# Patient Record
Sex: Female | Born: 1990 | Race: White | Hispanic: No | Smoking: Current every day smoker
Health system: Southern US, Community
[De-identification: ages and names within clinical notes are randomized; demographics above are authoritative.]

## PROBLEM LIST (undated history)

## (undated) DIAGNOSIS — E282 Polycystic ovarian syndrome: Secondary | ICD-10-CM

---
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
## 2008-01-05 ENCOUNTER — Emergency Department: Payer: Self-pay | Admitting: Emergency Medicine

## 2009-10-16 ENCOUNTER — Emergency Department: Payer: Self-pay | Admitting: Emergency Medicine

## 2011-04-16 ENCOUNTER — Emergency Department: Payer: Self-pay | Admitting: Emergency Medicine

## 2011-12-18 ENCOUNTER — Emergency Department: Payer: Self-pay | Admitting: Unknown Physician Specialty

## 2012-07-16 ENCOUNTER — Emergency Department: Payer: Self-pay | Admitting: Emergency Medicine

## 2012-07-16 LAB — URINALYSIS, COMPLETE
Bilirubin,UR: NEGATIVE
Blood: NEGATIVE
Nitrite: NEGATIVE
Ph: 6 (ref 4.5–8.0)
Specific Gravity: 1.026 (ref 1.003–1.030)
WBC UR: 20 /HPF (ref 0–5)

## 2012-07-16 LAB — HCG, QUANTITATIVE, PREGNANCY: Beta Hcg, Quant.: 125610 m[IU]/mL — ABNORMAL HIGH

## 2012-07-16 LAB — BASIC METABOLIC PANEL
Anion Gap: 7 (ref 7–16)
BUN: 6 mg/dL — ABNORMAL LOW (ref 7–18)
Calcium, Total: 8.8 mg/dL (ref 8.5–10.1)
Chloride: 107 mmol/L (ref 98–107)
Co2: 24 mmol/L (ref 21–32)
EGFR (African American): >60
EGFR (Non-African Amer.): >60
Glucose: 91 mg/dL (ref 65–99)
Osmolality: 273 (ref 275–301)
Sodium: 138 mmol/L (ref 136–145)

## 2012-07-16 LAB — CBC
HGB: 13.5 g/dL (ref 12.0–16.0)
MCH: 28.9 pg (ref 26.0–34.0)
MCV: 85 fL (ref 80–100)
RBC: 4.67 10*6/uL (ref 3.80–5.20)

## 2012-07-17 ENCOUNTER — Emergency Department: Payer: Self-pay | Admitting: Emergency Medicine

## 2012-07-17 LAB — COMPREHENSIVE METABOLIC PANEL
Albumin: 3.2 g/dL — ABNORMAL LOW (ref 3.4–5.0)
Anion Gap: 9 (ref 7–16)
BUN: 6 mg/dL — ABNORMAL LOW (ref 7–18)
Chloride: 108 mmol/L — ABNORMAL HIGH (ref 98–107)
Co2: 22 mmol/L (ref 21–32)
Creatinine: 0.62 mg/dL (ref 0.60–1.30)
EGFR (African American): >60
Osmolality: 274 (ref 275–301)
Potassium: 3.6 mmol/L (ref 3.5–5.1)
SGPT (ALT): 61 U/L (ref 12–78)
Sodium: 139 mmol/L (ref 136–145)
Total Protein: 7.3 g/dL (ref 6.4–8.2)

## 2012-07-17 LAB — CBC WITH DIFFERENTIAL/PLATELET
Basophil #: 0 10*3/uL (ref 0.0–0.1)
Basophil %: 0.4 %
Eosinophil %: 0.1 %
HGB: 12.9 g/dL (ref 12.0–16.0)
Lymphocyte #: 1.2 10*3/uL (ref 1.0–3.6)
Lymphocyte %: 14.2 %
MCH: 28.8 pg (ref 26.0–34.0)
MCHC: 33.9 g/dL (ref 32.0–36.0)
MCV: 85 fL (ref 80–100)
Monocyte #: 0.7 x10 3/mm (ref 0.2–0.9)
Neutrophil %: 77 %
RDW: 13.9 % (ref 11.5–14.5)
WBC: 8.3 10*3/uL (ref 3.6–11.0)

## 2012-07-17 LAB — URINALYSIS, COMPLETE
Bilirubin,UR: NEGATIVE
Blood: NEGATIVE
Glucose,UR: NEGATIVE mg/dL (ref 0–75)
Ph: 6 (ref 4.5–8.0)
Protein: 30
Specific Gravity: 1.029 (ref 1.003–1.030)
Squamous Epithelial: 31

## 2012-09-10 ENCOUNTER — Emergency Department: Payer: Self-pay | Admitting: Emergency Medicine

## 2012-09-10 LAB — URINALYSIS, COMPLETE
Bilirubin,UR: NEGATIVE
Glucose,UR: NEGATIVE mg/dL (ref 0–75)
Ketone: NEGATIVE
Leukocyte Esterase: NEGATIVE
Nitrite: NEGATIVE
Ph: 6 (ref 4.5–8.0)
Protein: 30
Specific Gravity: 1.021 (ref 1.003–1.030)

## 2012-09-10 LAB — COMPREHENSIVE METABOLIC PANEL
Albumin: 2.7 g/dL — ABNORMAL LOW (ref 3.4–5.0)
Alkaline Phosphatase: 100 U/L (ref 50–136)
Anion Gap: 11 (ref 7–16)
Bilirubin,Total: 0.1 mg/dL — ABNORMAL LOW (ref 0.2–1.0)
Calcium, Total: 8.5 mg/dL (ref 8.5–10.1)
Chloride: 107 mmol/L (ref 98–107)
Co2: 22 mmol/L (ref 21–32)
Creatinine: 0.63 mg/dL (ref 0.60–1.30)
EGFR (African American): >60
EGFR (Non-African Amer.): >60
Osmolality: 278 (ref 275–301)
SGOT(AST): 22 U/L (ref 15–37)
SGPT (ALT): 24 U/L (ref 12–78)
Sodium: 140 mmol/L (ref 136–145)

## 2012-09-10 LAB — CBC
HCT: 35.1 % (ref 35.0–47.0)
HGB: 11.8 g/dL — ABNORMAL LOW (ref 12.0–16.0)
MCH: 28.5 pg (ref 26.0–34.0)
MCHC: 33.5 g/dL (ref 32.0–36.0)
MCV: 85 fL (ref 80–100)
Platelet: 262 10*3/uL (ref 150–440)
RBC: 4.13 10*6/uL (ref 3.80–5.20)
RDW: 13.9 % (ref 11.5–14.5)
WBC: 15.7 10*3/uL — ABNORMAL HIGH (ref 3.6–11.0)

## 2012-11-10 ENCOUNTER — Inpatient Hospital Stay: Payer: Self-pay | Admitting: Obstetrics and Gynecology

## 2012-11-10 LAB — PIH PROFILE
Anion Gap: 7 (ref 7–16)
BUN: 10 mg/dL (ref 7–18)
Calcium, Total: 9.2 mg/dL (ref 8.5–10.1)
Chloride: 107 mmol/L (ref 98–107)
Co2: 24 mmol/L (ref 21–32)
Creatinine: 0.58 mg/dL — ABNORMAL LOW (ref 0.60–1.30)
EGFR (African American): >60
EGFR (Non-African Amer.): >60
Glucose: 74 mg/dL (ref 65–99)
HCT: 35.5 % (ref 35.0–47.0)
HGB: 12 g/dL (ref 12.0–16.0)
Osmolality: 273 (ref 275–301)
Potassium: 4.1 mmol/L (ref 3.5–5.1)
RBC: 4.11 10*6/uL (ref 3.80–5.20)
RDW: 14.4 % (ref 11.5–14.5)
SGOT(AST): 13 U/L — ABNORMAL LOW (ref 15–37)
WBC: 15.5 10*3/uL — ABNORMAL HIGH (ref 3.6–11.0)

## 2012-11-10 LAB — DRUG SCREEN, URINE
Cocaine Metabolite,Ur ~~LOC~~: NEGATIVE (ref ?–300)
Opiate, Ur Screen: NEGATIVE (ref ?–300)
Phencyclidine (PCP) Ur S: NEGATIVE (ref ?–25)
Tricyclic, Ur Screen: NEGATIVE (ref ?–1000)

## 2012-11-10 LAB — PROTEIN / CREATININE RATIO, URINE
Creatinine, Urine: 76.4 mg/dL (ref 30.0–125.0)
Protein, Random Urine: 22 mg/dL — ABNORMAL HIGH (ref 0–12)
Protein/Creat. Ratio: 288 mg/gCREAT — ABNORMAL HIGH (ref 0–200)

## 2012-11-11 LAB — PROTEIN, URINE, 24 HOUR
Collection Hours: 24 hours
Protein, 24 Hour Urine: 288 mg/24HR — ABNORMAL HIGH (ref 30–149)
Protein, Urine: 16 mg/dL (ref 0–12)
Total Volume: 1800 mL

## 2012-11-18 ENCOUNTER — Encounter: Payer: Self-pay | Admitting: Obstetrics & Gynecology

## 2012-11-18 LAB — URINALYSIS, COMPLETE
Bilirubin,UR: NEGATIVE
Blood: NEGATIVE
Glucose,UR: NEGATIVE mg/dL (ref 0–75)
Protein: NEGATIVE
RBC,UR: 3 /HPF (ref 0–5)
Squamous Epithelial: 30

## 2012-11-21 ENCOUNTER — Encounter: Payer: Self-pay | Admitting: Obstetrics & Gynecology

## 2012-12-27 ENCOUNTER — Observation Stay: Payer: Self-pay | Admitting: Obstetrics & Gynecology

## 2013-01-03 ENCOUNTER — Observation Stay: Payer: Self-pay | Admitting: Obstetrics and Gynecology

## 2013-01-10 ENCOUNTER — Observation Stay: Payer: Self-pay

## 2013-01-10 LAB — PROTEIN / CREATININE RATIO, URINE
Creatinine, Urine: 132.9 mg/dL — ABNORMAL HIGH (ref 30.0–125.0)
Protein, Random Urine: 98 mg/dL — ABNORMAL HIGH (ref 0–12)
Protein/Creat. Ratio: 737 mg/gCREAT — ABNORMAL HIGH (ref 0–200)

## 2013-01-10 LAB — PIH PROFILE
Anion Gap: 7 (ref 7–16)
BUN: 7 mg/dL (ref 7–18)
EGFR (African American): >60
Glucose: 91 mg/dL (ref 65–99)
HGB: 12.6 g/dL (ref 12.0–16.0)
MCH: 28.4 pg (ref 26.0–34.0)
Potassium: 4 mmol/L (ref 3.5–5.1)
RBC: 4.45 10*6/uL (ref 3.80–5.20)
Sodium: 137 mmol/L (ref 136–145)

## 2013-01-14 ENCOUNTER — Inpatient Hospital Stay: Payer: Self-pay | Admitting: Obstetrics and Gynecology

## 2013-01-14 LAB — PIH PROFILE
Anion Gap: 3 — ABNORMAL LOW (ref 7–16)
BUN: 8 mg/dL (ref 7–18)
Calcium, Total: 8.9 mg/dL (ref 8.5–10.1)
Chloride: 106 mmol/L (ref 98–107)
Co2: 27 mmol/L (ref 21–32)
Creatinine: 0.9 mg/dL (ref 0.60–1.30)
EGFR (Non-African Amer.): >60
Glucose: 67 mg/dL (ref 65–99)
HCT: 38.1 % (ref 35.0–47.0)
MCH: 26.3 pg (ref 26.0–34.0)
Osmolality: 269 (ref 275–301)
Platelet: 190 10*3/uL (ref 150–440)
Potassium: 5 mmol/L (ref 3.5–5.1)
RBC: 4.55 10*6/uL (ref 3.80–5.20)
RDW: 15.2 % — ABNORMAL HIGH (ref 11.5–14.5)
Uric Acid: 5.7 mg/dL (ref 2.6–6.0)

## 2013-01-14 LAB — PROTEIN / CREATININE RATIO, URINE
Protein, Random Urine: 99 mg/dL — ABNORMAL HIGH (ref 0–12)
Protein/Creat. Ratio: 743 mg/gCREAT — ABNORMAL HIGH (ref 0–200)

## 2013-01-15 LAB — PIH PROFILE
Anion Gap: 4 — ABNORMAL LOW (ref 7–16)
BUN: 9 mg/dL (ref 7–18)
Chloride: 108 mmol/L — ABNORMAL HIGH (ref 98–107)
EGFR (African American): >60
EGFR (Non-African Amer.): >60
HCT: 36.4 % (ref 35.0–47.0)
HGB: 12.1 g/dL (ref 12.0–16.0)
MCH: 28.1 pg (ref 26.0–34.0)
MCHC: 33.3 g/dL (ref 32.0–36.0)
Osmolality: 277 (ref 275–301)
RDW: 15.1 % — ABNORMAL HIGH (ref 11.5–14.5)

## 2013-01-16 LAB — PIH PROFILE
Anion Gap: 7 (ref 7–16)
BUN: 10 mg/dL (ref 7–18)
Calcium, Total: 8.6 mg/dL (ref 8.5–10.1)
Chloride: 108 mmol/L — ABNORMAL HIGH (ref 98–107)
Creatinine: 0.68 mg/dL (ref 0.60–1.30)
EGFR (African American): >60
EGFR (Non-African Amer.): >60
Glucose: 90 mg/dL (ref 65–99)
HCT: 36 % (ref 35.0–47.0)
MCH: 27.9 pg (ref 26.0–34.0)
Platelet: 147 10*3/uL — ABNORMAL LOW (ref 150–440)
RBC: 4.27 10*6/uL (ref 3.80–5.20)
SGOT(AST): 14 U/L — ABNORMAL LOW (ref 15–37)
Sodium: 138 mmol/L (ref 136–145)
Uric Acid: 4.8 mg/dL (ref 2.6–6.0)
WBC: 10.3 10*3/uL (ref 3.6–11.0)

## 2013-01-17 LAB — CBC WITH DIFFERENTIAL/PLATELET
Basophil #: 0.1 10*3/uL (ref 0.0–0.1)
Basophil %: 1.2 %
Eosinophil #: 0.1 10*3/uL (ref 0.0–0.7)
Eosinophil %: 1 %
HGB: 12.5 g/dL (ref 12.0–16.0)
Lymphocyte #: 3.1 10*3/uL (ref 1.0–3.6)
Lymphocyte %: 25.4 %
MCH: 27.9 pg (ref 26.0–34.0)
MCHC: 33.6 g/dL (ref 32.0–36.0)
MCV: 83 fL (ref 80–100)
Monocyte %: 5.5 %
Neutrophil #: 8.1 10*3/uL — ABNORMAL HIGH (ref 1.4–6.5)
RBC: 4.48 10*6/uL (ref 3.80–5.20)
WBC: 12 10*3/uL — ABNORMAL HIGH (ref 3.6–11.0)

## 2013-01-18 LAB — HEMATOCRIT: HCT: 38.7 % (ref 35.0–47.0)

## 2013-01-19 LAB — GC/CHLAMYDIA PROBE AMP

## 2013-12-02 ENCOUNTER — Emergency Department: Payer: Self-pay | Admitting: Emergency Medicine

## 2014-10-13 NOTE — Consult Note (Signed)
Referral Information:  Reason for Referral Followup inpatient consultation   Referring Physician [facility]   Prenatal Hx Monica Greene is a 24 year-old G1 P0 at 2626 3/7 weeks carrying a diamniotic/dichorionic twin gestation who was recently admitted with hypertension.  We were asked to see her as an inpatient consultation.  See that consultation by Dr. Leatha GildingLivingston dated 11/11/12.  Since then, Monica Greene has been discharged and is doing well on bedrest.  She has not been checking her blood pressures at home but [facility] and states that her blood pressure was "OK."  She feels well and denies headache, blurry vision, edema or abdominal pain. She reports excellent fetal movement and denies contractions, pelvic pressure, urinary symptoms or change in vaginal discharge.  She is here for a followup visit after her hospitalziation.  She is not taking anti-hypertensive medications.   Past Obstetrical Hx G1 P0   Home Medications: Medication Instructions Status  Prenatal 1 Prenatal Multivitamins with Folic Acid 0.975 mg oral capsule 1 cap(s) orally once a day Active   Allergies:   No Known Allergies:   Vital Signs/Notes:  Nursing Vital Signs: **Vital Signs.:   29-May-14 10:02  Pulse Pulse 88  Systolic BP Systolic BP 126  Diastolic BP (mmHg) Diastolic BP (mmHg) 72   Perinatal Consult:  Past Medical History cont'd See Consult dated 11/11/12 for full history.   Exam:  Abdomen Soft, NT, ND, NG.  Extremities: No edema, no cords.   Routine UA:  29-May-14 10:00   Protein (UA) Negative    Additional Lab/Radiology Notes 24 Hour urine protein (11/11/12): 288 mg (1800 mL total volume)  [facility] See separate note. Twin A: EFW 784g (21%), MVP 4.2 cm Twin B: EFW 797g (25%), MVP 6.5 cm   Impression/Recommendations:  Impression 24 year-old G1 P0 at 2426 3/7 weeks with di/di twins and recent admission for hypertension.  Unclear if this represents  gestational hypertension or some element of choric hypertension.  Normotensive today at visit without evidence of preeclampsia.   Recommendations -See Dr. Inez CatalinaLivington's consult note dated 11/11/12 for pregnancy recommendations, which include fetal surveillance plans. We will be happy to perform any of this recommended testing if desired. -Continue modified bedrest. Pt is out of work -We discussed signs and symptoms of preeclampsia.  Monica Greene will call [facility] if she develops any symptoms.  If develops signs or symptoms of preeclampsia, please contact us and we will be happy to re-evaluate her. -Should her blood pressure be consistently greater than 140/90, can start labetalol (start 100 mg twice daily). -Please contact us if you would like us to continue to follow her.    Total Time Spent with Patient 15 minutes   >50% of visit spent in couseling/coordination of care yes   Office Use Only [address]9213  Office Visit Level 3 (15min) EST exp prob focused outpt   Coding Description: MATERNAL CONDITIONS/HISTORY INDICATION(S).   HTN - Gestational/Pregnancy Induced PIH.  Electronic Signatures: Mirabel Ahlgren, [place] (MD)  (Signed 29-May-14 10:55)  Authored: Referral, Home Medications, Allergies, Vital Signs/Notes, Consult, Exam, Lab, Lab/Radiology Notes, Impression, Billing, Coding Description   Last Updated: 29-May-14 10:55 by Kaylynn Chamblin, [place] (MD)

## 2014-10-13 NOTE — Consult Note (Signed)
Referral Information:  Reason for Referral In pt consult for di/di twin gestation at 25 4/7 with recent HTN , headache and edema admitted 5/21   Referring Physician Brothers CN[facility]   Prenatal Hx 24 yo G1 p0 at 25 3/7 by LMP 05/15/12 [facility]  with twins di/di . Pt with h/o PCOS and increased BMI used clomid for ovulation induction. Pt is a smoker 2-3 cigs /day. Her BP at her office visits prior to yesterday 120-138/56-82 . No prior h/o HTN and no medications. Pt had annual visits at [facility] and had no BP issues with OCPs. Pt with elevated BMI 214 lbs at her new ob , recent weight gain with a 6 lb increase over the last 2 weeks . 20 lbs overall .Yesterday BP was noted to be increased to 140/90 and was elevated to as high as 157/92 initially but dropped to 110/70 range overnight whiel resting on her side.Pt says she has had increasing edema over last 2 weeks- at first went away overnight , but now has stayed . She had to remove her rings. No facial edema . Pt has noticed sparkling  lights during her  morning shower over last 3 weeks not related to HA. No epigastric pain. Preclampsia labs are normal creat 0.58, AST 13, plt 242K , uric acid 4.2, p/c ratio borderline at 288 collecting 24 hour urine   Past Obstetrical Hx primigravida   Home Medications: Medication Instructions Status  Prenatal 1 Prenatal Multivitamins with Folic Acid 02.706mg oral capsule 1 cap(s) orally once a day Active   Allergies:   No Known Allergies:   Vital Signs/Notes:  Nursing Vital Signs: **Vital Signs.:   22-May-14 13:03  Vital Signs Type Perinatal Clinic  Temperature Temperature (F) 97.9  Celsius 36.6  Temperature Source oral  Pulse Pulse 81  Respirations Respirations 18  Systolic BP Systolic BP 1237 Diastolic BP (mmHg) Diastolic BP (mmHg) 79  Mean BP 94   Perinatal Consult:  LMP 15-May-2012   PGyn Hx infertility  clomid pregnancy   Past Medical History  cont'd -PCOS- she was on OCPs for years - no conception for 3 years and was found to be anovulatory  -elevated BMI - all her life worse in teen years  -smoker- light 2-3 /day her husband smokes also  -Migraines none in 4 years - soem bad enough to take her to ER for a shot   PSurg Hx none   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   Soc Hx married, tobacco   Review Of Systems:  Tolerating Diet Yes   Medications/Allergies Reviewed Medications/Allergies reviewed     Thyroid:  21-May-14 17:12   Thyroid Stimulating Hormone 2.11 (0.45-4.50 (International Unit)  ----------------------- Pregnant patients have  different reference  ranges for TSH:  - - - - - - - - - -  Pregnant, first trimetser:  0.36 - 2.50 uIU/mL)  Hepatic:  21-May-14 17:12   SGOT (AST)  13 (Result(s) reported on 10 Nov 2012 at 05:32PM.)  Routine BB:  21-May-14 17:12   ABO Group + Rh Type A Positive  Antibody Screen NEGATIVE (Result(s) reported on 10 Nov 2012 at 06:21PM.)  General Ref:  21-May-14 17:12   RPR Titer ========== TEST NAME ==========  ========= RESULTS =========  = REFERENCE RANGE =  RPR TITER  Rapid Plasma Reagin, Quant Rapid Plasma Reagin, Quant      [   Non Reactive         ]  NonRea<1:1               [facility]            No: [id number]           [address], [place], [place] [address]           Lindon Romp, MD         [phone]   Result(s) reported on 11 Nov 2012 at 01:50PM.  Rubella IgG/Immune Status ========== TEST NAME ==========  ========= RESULTS =========  = REFERENCE RANGE =  RUBELLA IGG/IMMUNE STATU  Rubella Antibodies, IgG Rubella Antibodies, IgG         [   <0.90 index          ]                               Non-immune       <0.90                                                Equivocal  0.90 - 0.99                                                Immune           >0.99               [facility]            No: [id number] [address] [place], [place] [address]           Lindon Romp, MD         [phone]   Result(s) reported on 11 Nov 2012 at 04:50AM.  Varicella Zoster IgG Abs ========== TEST NAME ==========  ========= RESULTS =========  = REFERENCE RANGE =  VARICELLA ZOSTER IGG ABS  Varicella-Zoster V Ab, IgG Varicella Zoster IgG            [   182 index            ]       Immune >165               Negative          <135                                               Equivocal    135 - 165                                               Positive          >165                A positive result generally indicates exposure to the             pathogen or administration of specific immunoglobulins,  but it is not indication of active infection or stage                of disease.               [facility]            No: [id number]           [address] [place], [place] [address]           Lindon Romp, MD         [phone]   Result(s) reported on 11 Nov 2012 at 01:50PM.  HBsAg ========== TEST NAME ==========  ========= RESULTS =========  = REFERENCE RANGE =  HEPATITIS B SURFACE AG  HBsAg Screen HBsAg Screen                    [   Negative             ]          Negative               [facility]            No: [id number]           [address], [place], [place] [address]           Lindon Romp, MD         [phone]   Result(s) reported on 11 Nov 2012 at 08:50AM.  Routine Chem:  21-May-14 17:12   Uric Acid, Serum 4.2 (Result(s) reported on 10 Nov 2012 at 05:32PM.)  Glucose, Serum 74  BUN 10  Creatinine (comp)  0.58  Sodium, Serum 138  Potassium, Serum 4.1  Chloride, Serum 107  CO2, Serum 24  Osmolality (calc) 273  Anion Gap 7  Calcium (Total), Serum 9.2  eGFR (Non-African American) >60 (eGFR values <79m/min/1.73 m2 may be an indication of chronic kidney disease (CKD). Calculated eGFR is useful in patients with stable renal  function. The eGFR calculation will not be reliable in acutely ill patients when serum creatinine is changing rapidly. It is not useful in  patients on dialysis. The eGFR calculation may not be applicable to patients at the low and high extremes of body sizes, pregnant women, and vegetarians.)  eGFR (African American) >60  Misc Urine Chem:  21-May-14 17:08   Creatinine, Urine 76.4  Protein, Random Urine  22  Protein/Creat Ratio (comp)  288 (Result(s) reported on 10 Nov 2012 at 05:57PM.)  Urine Drugs:  [id number]  Tricyclic Antidepressant, Ur Qual (comp) NEGATIVE (Result(s) reported on 10 Nov 2012 at 05:52PM.)  Amphetamines, Urine Qual. NEGATIVE  MDMA, Urine Qual. NEGATIVE  Cocaine Metabolite, Urine Qual. NEGATIVE  Opiate, Urine qual NEGATIVE  Phencyclidine, Urine Qual. NEGATIVE  Cannabinoid, Urine Qual. NEGATIVE  Barbiturates, Urine Qual. NEGATIVE  Benzodiazepine, Urine Qual. NEGATIVE (----------------- The URINE DRUG SCREEN provides only a preliminary, unconfirmed analytical test result and should not be used for non-medical  purposes.  Clinical consideration and professional judgment should be  applied to any positive drug screen result due to possible interfering substances.  A more specific alternate chemical method must be used in order to obtain a confirmed analytical result.  Gas chromatography/mass spectrometry (GC/MS) is the preferred confirmatory method.)  Methadone, Urine Qual. NEGATIVE  Routine Hem:  21-May-14 17:12   WBC (CBC)  15.5  RBC (CBC) 4.11  Hemoglobin (CBC) 12.0  Hematocrit (CBC) 35.5  Platelet Count (CBC) 242 (Result(s) reported on  10 Nov 2012 at 05:32PM.)  MCV 86  MCH 29.3  MCHC 33.9  RDW 14.4   Impression/Recommendations:  Impression DI/DI Twin IUP at 25 3/7 Elevation of BP with borderline p/c ratio- now improved with bedrest , neg tox, normal TSH no other evident cause for HTN except pregnancy, not sure if her reports of flashing lights  reflect scotomata  Elevated BMI h/o PCOS - there may be an underlying component of mild essential hpertension related to "metabolic syndrome" type picture ? - her baseline BP was 13/56- pt says she has never been told of elevated BP at annual visits h/o MIgraine HAs- HA yesterday was not  typical of migraien and has resolved.   Recommendations 1 agree with plan to hold off on Betamethasone given rapid improvement of BP with bedrest - I would administer if BPs start to be elevated more consistently or picture becomes more c/w preeclampsia 2 agree with 24 hour urine 3 likely too late for baby aspirin to be of benefit but with her weight and these issues would use starting at 12 weeks in future pregnancies 4 completion of 24 hour urine - finsihes tonight at 8:30- I woudl think ok for discharge after that   5 Weekly follow up - We are happy to see in Perinatal consutl in one week, if helpful  6 q 3-4 scans to monitor growth of twins 7 needs glucola at 26-28 w - high risk due to obesity, PCOS  and twins - RBS in hospital 74 7 with current BPs I would not start antihypertensives but if more consistently over 140/90 range would start labetolol in hopes of temporizing her condition and achieving greater gest age. 8 Activity - given how nicely her BP normalized in house I suggested limiting her activity to light normal, I reviewed lack of study data to support activity limitation but it seems to improve her BP . I suggested she discuss with her OB . 9 For twins we add additional 1mg folic acid and supplemental iron tablet in addition to PNV   Plan:  Genetic Counseling no   Ultrasound at what gestational ages Monthly >24 weeks   Antepartum Testing Starting at 32 weeks, Weekly, if BP remains elevated   Delivery Mode depends on fetal positions    Total Time Spent with Patient 30 minutes   >50% of visit spent in couseling/coordination of care yes   Office Use Only 99[address]  INPT Consult Problem  Focused (20 min)   Coding Description: MULTIPLE GESTATION INDICATION(S).   Twin gestation: di/di.   MATERNAL CONDITIONS/HISTORY INDICATION(S).   HTN - Gestational/Pregnancy Induced PIH.  Electronic Signatures: Livingston, Elizabeth Gresham (MD)  (Signed 22-May-14 15:08)  Authored: Referral, Home Medications, Allergies, Vital Signs/Notes, Consult, Exam, Lab, Impression, Plan, Billing, Coding Description   Last Updated: 22-May-14 15:08 by Livingston, Elizabeth Gresham (MD) 

## 2014-10-13 NOTE — Consult Note (Signed)
   Maternal Age 24   Gravida 1   Para 0   Term Deliveries 0   Preterm Deliveries 0   Abortions 0   Living Children 0   Final EDD (dd-mmm-yy) 19-Feb-2013   Blood Type (Maternal) A positive   Antibody Screen Results (Maternal) negative   HIV Results (Maternal) negative   Hepatitis C Culture (Maternal) unknown   Herpes Results (Maternal) n/a   VDRL/RPR/Syphilis Results (Maternal) negative   Rubella Results (Maternal) nonimmune   Hepatitis B Surface Antigen Results (Maternal) negative   Group B Strep Results Maternal (Result >5wks must be treated as unknown) unknown/result > 5 weeks ago   Prenatal Care Adequate    Additional Comments Asked by Dr. Jean RosenthalJackson to provide prenatal consultation to this 24 y.o  G1 mother with twins at 35+ wks EGA who will have C/section later today due to PIH, decreased fetal growth, and placental insufficiency.  Both twins in breech presentation.  EFW's < 4 lbs (per parents).  Talked with patient and FOB - also present were maternal GM and paternal aunt.  Described usual procedure at delivery in OR, including possible need for resuscitation.  Then presented expectations for SGA preterm infants at 35 wks, including possible respiratory distress requiring O2 or respiratory support, hypothermia, feeding problems, and need for IV support.  Presented uncertain LOS, possibly 2 - 3 wks depending on babies' feeding skills.  Discussed desirability of feeding mother's milk - she plans to pump and breast feed ASAP after delivery.  Patient and FOB were attentive and asked appropriate questions.  They expressed appreciation for my input.  Thank you for consulting Neonatology.   Parental Contact Parents informed at length regarding prenatal care and plan, Face-to-face time 20 minutes   Electronic Signatures: Serita GritWimmer, John E (MD)  (Signed 28-Jul-14 22:37)  Authored: PREGNANCY and LABOR, ADDITIONAL COMMENTS   Last Updated: 28-Jul-14 22:37 by Serita GritWimmer, John E  (MD)

## 2014-10-13 NOTE — Op Note (Signed)
PATIENT NAME:  Monica Greene, Aurielle R MR#:  [id number] DATE OF BIRTH:  February 03, 1991  DATE OF PROCEDURE:  01/17/2013  PREOPERATIVE DIAGNOSES:  1. Intrauterine pregnancy at 35 weeks 2 days gestational age.  2. Dichorionic diamnionic twin gestation.  3. Both twins breech presentation.  4. Severe preeclampsia with intrauterine growth restriction of both twins and absent end-diastolic flow for twin B.   POSTOPERATIVE DIAGNOSES:  1. Intrauterine pregnancy at 35 weeks 2 days gestational age.  2. Dichorionic diamnionic twin gestation.  3. Both twins breech presentation.  4. Severe preeclampsia with intrauterine growth restriction of both twins and absent end-diastolic flow for twin B.   PROCEDURE: Primary low transverse cesarean section via Pfannenstiel incision.   ANESTHESIA: Spinal.   SURGEON: Conard NovakStephen D. Shelbe Haglund, MD  ASSISTANT: Elizebeth BrookingAmanda Orr.  ESTIMATED BLOOD LOSS: 750 mL.   OPERATIVE FLUIDS: 900 mL crystalloid.   COMPLICATIONS: None.   FINDINGS:  1. Viable twin gestation with 2 gestational sacs.  2. Both twins in breech presentation.  3. Normal-appearing gravid uterus, fallopian tubes and ovaries.   SPECIMENS: Both placentae.    CONDITION: Stable at the end at the end of procedure.   INDICATIONS FOR PROCEDURE: The patient is a 24 year old gravida 1, para 0 female at 3835 weeks and 2 days gestational age with good dating. She has been in the hospital and followed as an outpatient for preeclampsia. [facility], Dr. Dolphus JennySmall. Ultrasound was performed, and she was found to have growth restriction of both twins with absent end-diastolic flow through the umbilical cord for one of the twins, indicating need for delivery. Because both twins were breech, the patient was taken to the operating room for abdominal delivery.   DESCRIPTION OF PROCEDURE: The patient was taken to the operating room, where a regional anesthesia was administered and found to be adequate. She was placed in  the dorsal supine position with a leftward tilt and then prepped and draped in the usual sterile fashion. After timeout was called, a Pfannenstiel incision was made and carried through the various layers until the peritoneum was identified and entered sharply. The peritoneal incision was extended in the cranial and caudal directions. The bladder flap was created, and a bladder retractor was used to pull the bladder out of the operative area of interest. A low transverse hysterotomy was made with the scalpel and extended laterally with both cranial and caudal tension. Fetus A, which was on the maternal left, was delivered first and delivered from a frank breech presentation. A sterile moist blue towel was used to hold onto the hips of the infant as the infant was delivered breech without difficulty. After a period of about 45 seconds of allowing blood to flow through the umbilical cord, the cord was clamped, then cut, and baby A was handed off to the NICU physician. Attention was then turned to the second gestational sac, which was then ruptured for clear fluid, and notably, sac for baby A was also clear fluid on the left. This was clear fluid from baby B on the right. Her feet were grasped and the breech was elevated to the hysterotomy, and she was delivered in a similar fashion using a sterile moist blue towel without difficulty. Again, approximately 30 to 45 seconds was allowed for fetal transfusion through the umbilical cord, and then the cord was clamped and cut, and the infant was handed off to the awaiting NICU doctor. The cord blood was collected for each infant and the placentae were  delivered then, and the uterus was exteriorized and cleared of all clots and debris. The hysterotomy was closed using #0 Vicryl in a running locked fashion. A second layer of the same suture was used to obtain hemostasis. The uterus was returned to the abdomen, and the gutters were cleared of all clots and debris. The peritoneum  was closed using #0 Vicryl in a running fashion.   The On-Q pump catheters were placed according to the manufacturer's recommendation. The catheters were inserted at approximately 4 cm cephalad to the incision line, approximately 1 cm apart. They were inserted to a depth of approximately the fourth marking on the catheter and positioned just superficial to the rectus abdominis muscles and just deep to the rectus fascia. The rectus fascia was then closed using #0 Maxon using 2 stitches, each starting at the lateral apices and meeting in the midline, where they were tied together. The subcutaneous fat was then closed using #2-0 plain gut, and the skin was closed using the Insorb subcuticular stapler. The incision was then reapproximated and reinforced using Dermabond.   The On-Q pump catheters were affixed to the skin using Dermabond as well as Steri-Strips and Tegaderm. Each of the catheters was bolused with 0.5% Marcaine plain 5 mL in each catheter, for a total of 10 mL total bolus.   The patient tolerated the procedure well. Sponge, lap and needle counts were correct x2. For VTE prophylaxis, the patient was wearing pneumatic compression stockings throughout the entire procedure. For antibiotics, she received 3 grams of Ancef prior to skin incision and within 60 minutes of skin incision. For neuro protection for the patient, she had magnesium sulfate on board given her severe preeclampsia. The patient was taken to the recovery area in stable condition at end of the procedure.   ____________________________ Conard Novak, MD sdj:OSi D: 01/18/2013 04:39:21 ET T: 01/18/2013 05:52:05 ET JOB#: [id number]  cc: Conard Novak, MD, <Dictator> Conard Novak MD ELECTRONICALLY SIGNED 01/25/2013 10:56

## 2014-10-24 IMAGING — US US OB FOLLOW-UP - NRPT [facility]
1 series · 14 of 28 positions shown · non-contrast
Comparison: none

[Series 1: us ob follow-up - nrpt [facility] · 0.26mm/px · 14 of 82 slices shown]
[im 4/82]
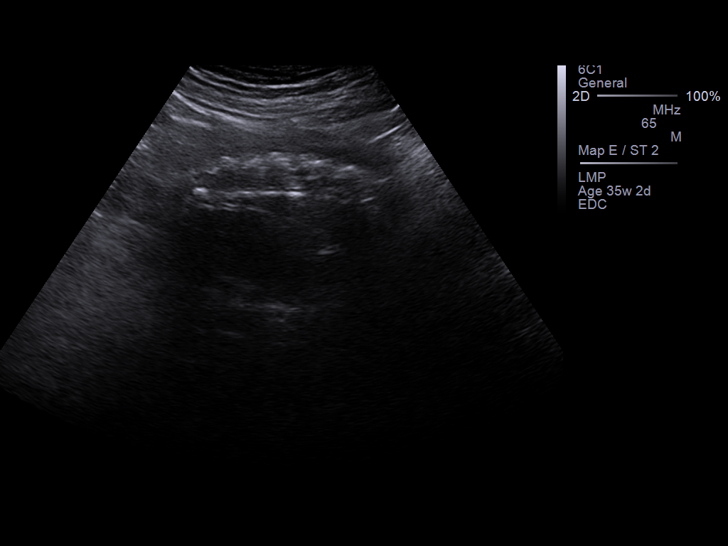
[im 10/82]
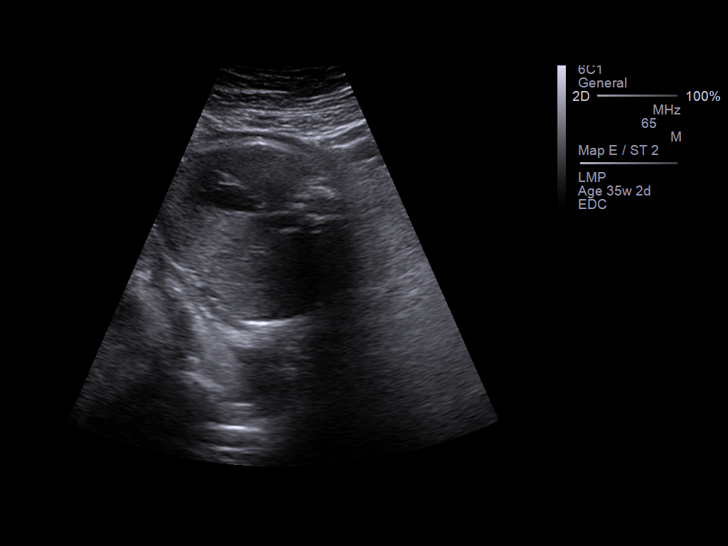
[im 16/82]
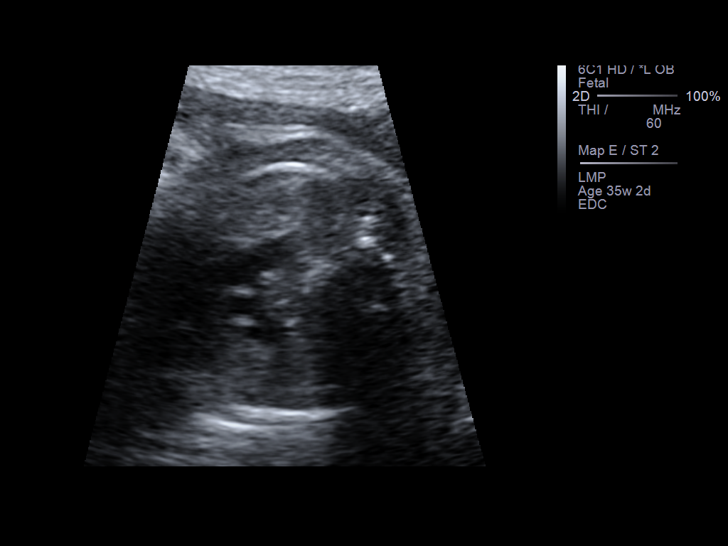
[im 22/82]
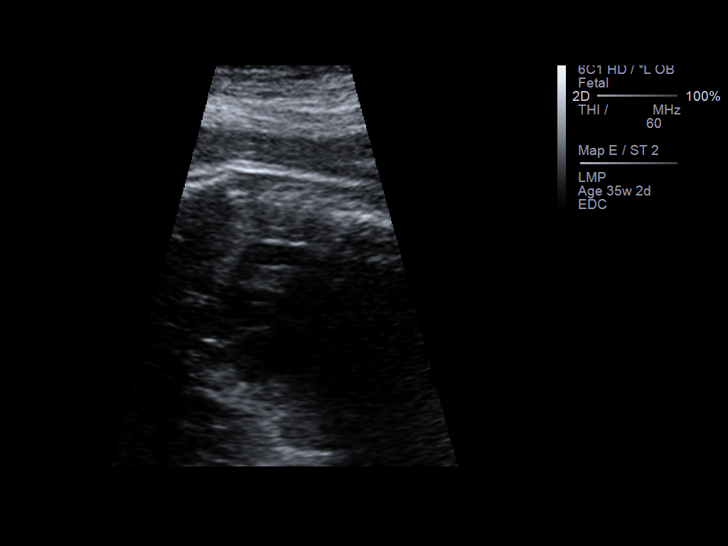
[im 28/82]
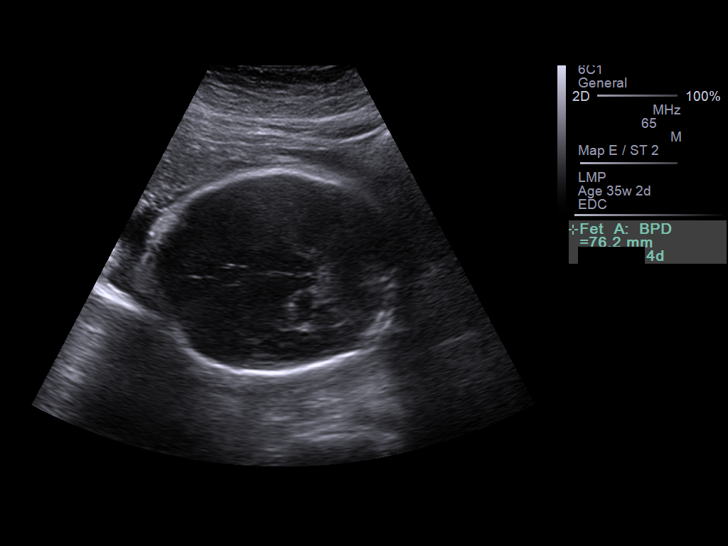
[im 34/82]
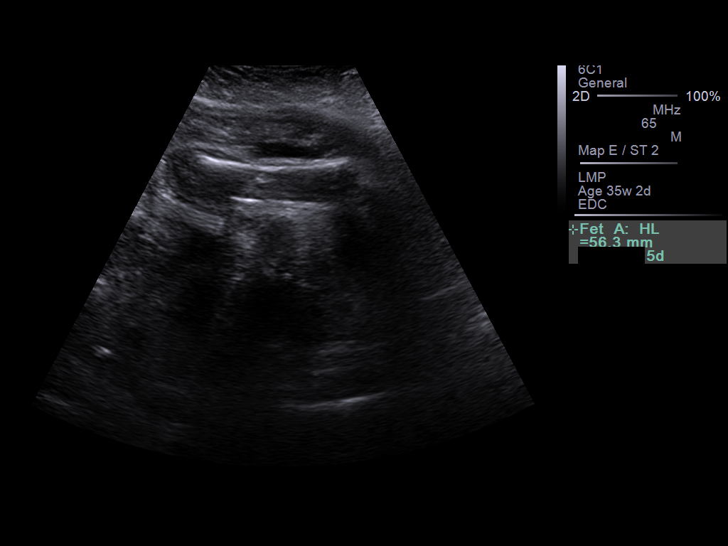
[im 40/82]
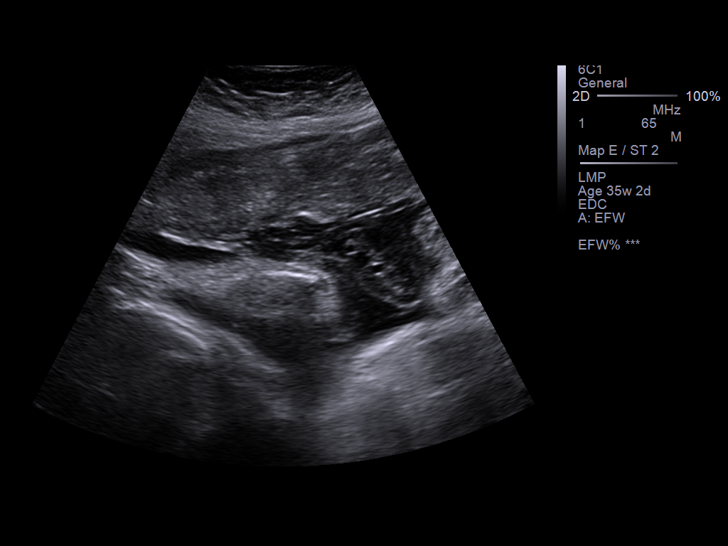
[im 46/82]
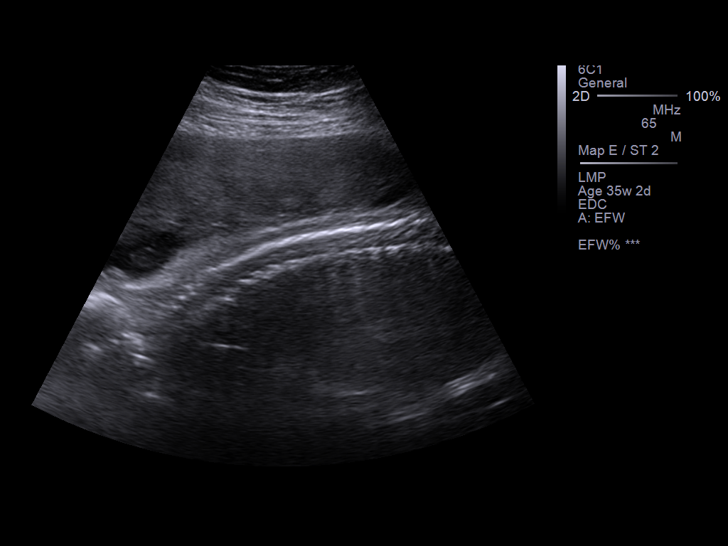
[im 52/82]
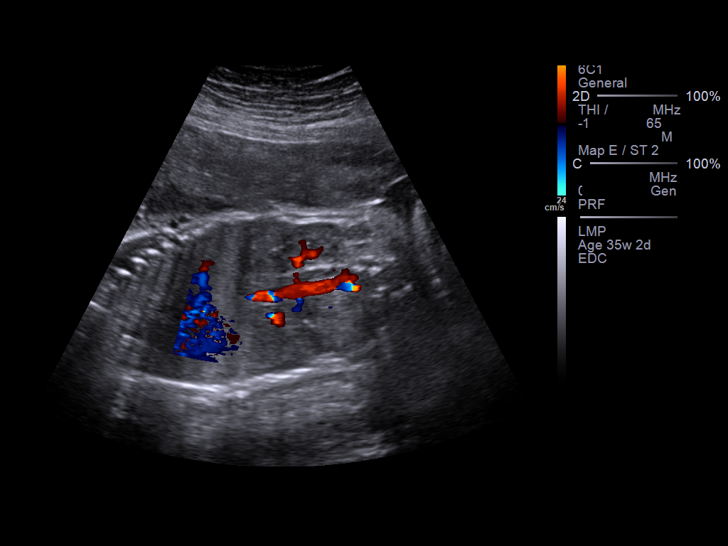
[im 58/82]
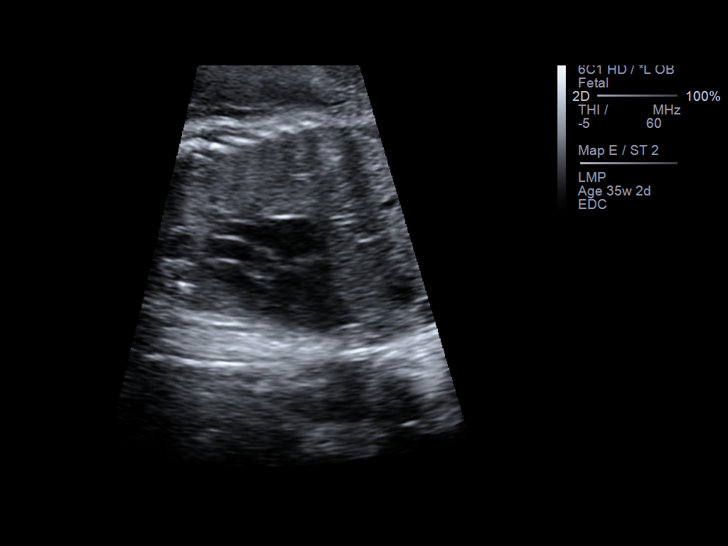
[im 64/82]
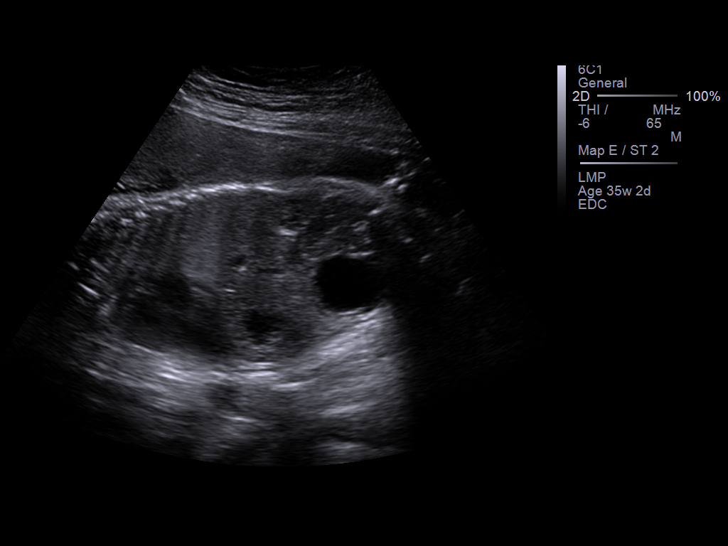
[im 70/82]
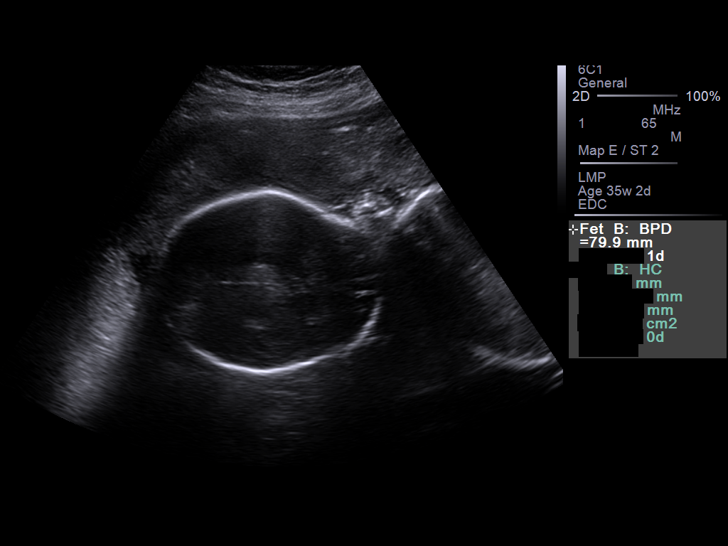
[im 76/82]
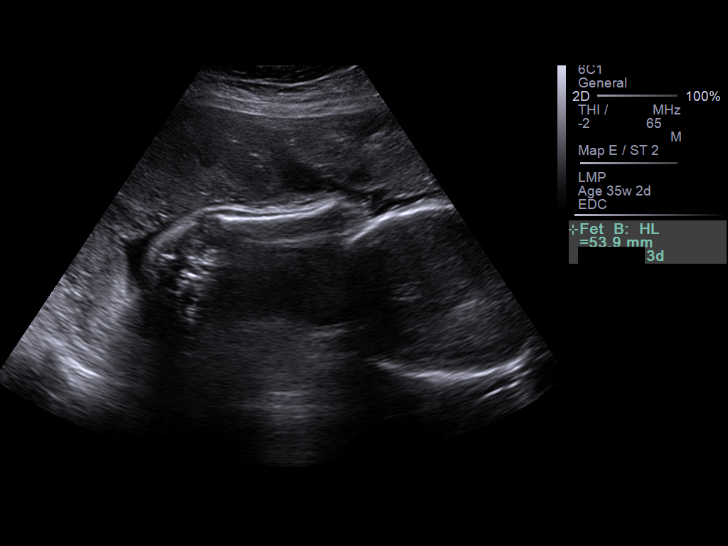
[im 82/82]
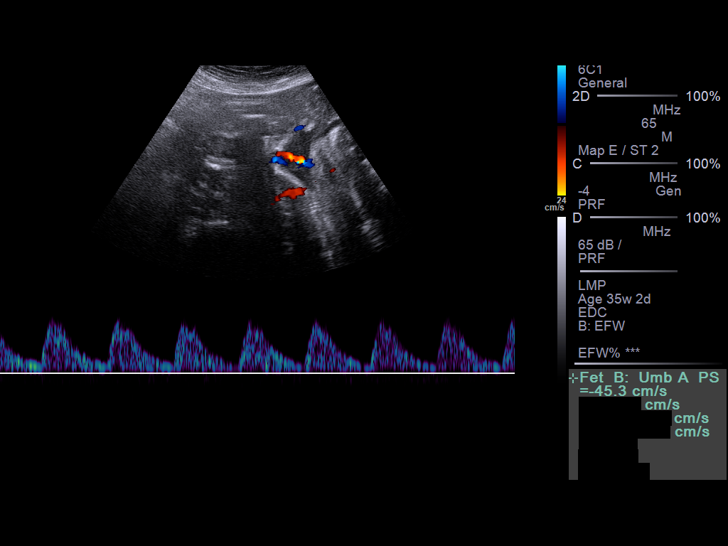

[14 of 28 positions shown; findings below may reference images not displayed]

IMAGES IMPORTED FROM THE SYNGO WORKFLOW SYSTEM
NO DICTATION FOR STUDY

## 2014-10-31 NOTE — H&P (Signed)
L&D Evaluation:  History:  HPI 24yo G1P0 @ 34.6wk di/di twin IUP EDC 02/19/13 by LMP presents from the office with elevated BP.  BP in the office 130s/100s.  She had been seen 4 days ago on L&D for preeclampsia evaluation.  CMP and CBC were WNL but P/C ratio was 743.  Protein in a 24hr urine collection correlated with that, which was an increase from ~300mg  one month ago.  She denies HA, visual changes, RUQ pain on admission.  Reports good fetal movement.  PNC   Presents with Elevated BP   Patient's Medical History No Chronic Illness   Patient's Surgical History none   Medications Pre Natal Vitamins   Allergies NKDA   Social History tobacco   Family History Non-Contributory   ROS:  ROS All systems were reviewed.  HEENT, CNS, GI, GU, Respiratory, CV, Renal and Musculoskeletal systems were found to be normal.   Exam:  Vital Signs 110s-120s/80s on bedrest   Urine Protein not completed   General no apparent distress   Mental Status clear   Chest clear   Heart normal sinus rhythm   Abdomen gravid, non-tender   Fetal Position Vertex/Breech on last available uls 2 mos ago   Edema no edema   Pelvic Deferred   Mebranes Intact   FHT normal rate with no decels   Fetal Heart Rate 140   Ucx irregular   Skin dry   Impression:  Impression 34.6wk di/di twin IUP, findings c/w mild preeclampsia   Plan:  Comments 1)  Repeat labs essentially unchanged from 4 days ago. 2)  24hr urine collection to evaluate for worsening proteinuria. 3)  Monitor FHR and for sx of worsening preeclampsia.   Electronic Signatures: Senaida LangeWeaver-Lee, Jantz Main (MD)  (Signed 25-Jul-14 15:44)  Authored: L&D Evaluation   Last Updated: 25-Jul-14 15:44 by Senaida LangeWeaver-Lee, Ziair Penson (MD)

## 2014-10-31 NOTE — H&P (Signed)
L&D Evaluation:  History:  HPI 24 yo G1 @ 4254w4d Di/Di twin gestation dated by LMP=8wk US derived EDD of 02/19/2013.  Her pregnancy thus far has been uncomplicated.  Last US on 10/25/2012 at 23 weeks was follow up anatomy no growth measurement obtained MVP A 6.33cm, B 4.08cm.  Note was made of an echogenic focus in the left ventricle of fetus A.  Growth at 20 week anatomy US was concordant A 313g, B 310g.    Over the last week patient has noted increased lower extremity edema.  She has had intermitten headaches which have self resolved.  Some scotomata.  Does have history of headaches outside of pregnancy.  BP in clinic today 142/90 with +1 protein on urine dip.  PNL have not been previously obtained secondary to medicaid pending status.  Likewise not 1st trimester screening or second trimester genetic screening results are available.  Other than the echogenic focus on A no other soft markers for down syndrome were noted.  +FM, no LOF, no VB, no ctx.  Denies RUQ/epigastric pain.  Total weight gain to date this pregnancy 18Lbs, 6lbs in last 16 days.   Presents with Evaluate for PreE   Patient's Medical History No Chronic Illness   Patient's Surgical History none   Medications Pre Natal Vitamins   Allergies NKDA   Social History none   Family History Non-Contributory   ROS:  ROS All systems were reviewed.  HEENT, CNS, GI, GU, Respiratory, CV, Renal and Musculoskeletal systems were found to be normal., unless otherwise noted in HPI   Exam:  Vital Signs 153/85; 157/80; 157/92; 116/71    Urine Protein not completed, Pr/Cr ratio pending   General no apparent distress   Mental Status clear   Chest no increased work of breathing   Abdomen gravid, non-tender   Estimated Fetal Weight Large for gestational age   Edema Trace BLE pretibial edema   Reflexes 1+   Clonus negative   FHT normal rate with no decels, A: 160, mod, no accels, no decels B:150, mod, no accels, no decels   secondary to fetal movement non-contiguous tracing   Ucx irregular, absent   Other Bedside US A: vtx, B: breech   Impression:  Impression evaluation for PIH   Plan:  Plan EFM/NST, monitor BP   Comments - PreE panel - Pr/Cr ratio - PNL drawn - TSH - UDS - pending results or labs, serial BP's will consider inpatient vs outpatient 24-hr urine.  If inpatient consider DP consult in AM for growth US.   Electronic Signatures: Lorrene ReidStaebler, Kimimila Tauzin M (MD)  (Signed 21-May-14 17:59)  Authored: L&D Evaluation   Last Updated: 21-May-14 17:59 by Lorrene ReidStaebler, Kaelyn Nauta M (MD)

## 2016-02-25 ENCOUNTER — Emergency Department: Payer: Medicaid Other

## 2016-02-25 ENCOUNTER — Emergency Department
Admission: EM | Admit: 2016-02-25 | Discharge: 2016-02-25 | Disposition: A | Discharge status: Home / Self Care | Payer: Medicaid Other | Attending: Emergency Medicine | Admitting: Emergency Medicine

## 2016-02-25 ENCOUNTER — Encounter: Payer: Self-pay | Admitting: Emergency Medicine

## 2016-02-25 DIAGNOSIS — M546 Pain in thoracic spine: Secondary | ICD-10-CM | POA: Insufficient documentation

## 2016-02-25 DIAGNOSIS — R0789 Other chest pain: Secondary | ICD-10-CM | POA: Insufficient documentation

## 2016-02-25 DIAGNOSIS — R079 Chest pain, unspecified: Secondary | ICD-10-CM

## 2016-02-25 DIAGNOSIS — F17210 Nicotine dependence, cigarettes, uncomplicated: Secondary | ICD-10-CM | POA: Insufficient documentation

## 2016-02-25 HISTORY — DX: Polycystic ovarian syndrome: E28.2

## 2016-02-25 LAB — CBC
HCT: 43.2 % (ref 35.0–47.0)
HEMOGLOBIN: 14.7 g/dL (ref 12.0–16.0)
MCH: 28.8 pg (ref 26.0–34.0)
MCHC: 34 g/dL (ref 32.0–36.0)
MCV: 84.7 fL (ref 80.0–100.0)
PLATELETS: 253 10*3/uL (ref 150–440)
RBC: 5.1 MIL/uL (ref 3.80–5.20)
RDW: 13.7 % (ref 11.5–14.5)
WBC: 13.2 10*3/uL — ABNORMAL HIGH (ref 3.6–11.0)

## 2016-02-25 LAB — BASIC METABOLIC PANEL
ANION GAP: 7 (ref 5–15)
BUN: 6 mg/dL (ref 6–20)
CALCIUM: 8.9 mg/dL (ref 8.9–10.3)
CO2: 24 mmol/L (ref 22–32)
CREATININE: 0.7 mg/dL (ref 0.44–1.00)
Chloride: 105 mmol/L (ref 101–111)
Glucose, Bld: 121 mg/dL — ABNORMAL HIGH (ref 65–99)
Potassium: 4 mmol/L (ref 3.5–5.1)
SODIUM: 136 mmol/L (ref 135–145)

## 2016-02-25 LAB — FIBRIN DERIVATIVES D-DIMER (ARMC ONLY): FIBRIN DERIVATIVES D-DIMER (ARMC): 321.12 (ref 0–499)

## 2016-02-25 LAB — TROPONIN I: Troponin I: <0.03 ng/mL (ref <0.03)

## 2016-02-25 MED ORDER — GI COCKTAIL ~~LOC~~
30.0000 mL | Freq: Once | ORAL | Status: AC
  Administered 2016-02-25: 30 mL via ORAL
  Filled 2016-02-25: qty 30

## 2016-02-25 MED ORDER — KETOROLAC TROMETHAMINE 60 MG/2ML IM SOLN
60.0000 mg | Freq: Once | INTRAMUSCULAR | Status: AC
  Administered 2016-02-25: 60 mg via INTRAMUSCULAR
  Filled 2016-02-25: qty 2

## 2016-02-25 MED ORDER — SUCRALFATE 1 G PO TABS: 1.0000 g | ORAL_TABLET | Freq: Four times a day (QID) | ORAL | 1 refills | Status: DC

## 2016-02-25 NOTE — ED Notes (Signed)

## 2016-02-25 NOTE — ED Notes (Signed)
MD at bedside. 

## 2016-02-25 NOTE — ED Provider Notes (Signed)
-----------------------------------------   8:55 AM on 02/25/2016 -----------------------------------------   Blood pressure 116/84, pulse 78, temperature 97.8 F (36.6 C), temperature source Oral, resp. rate 20, height 5\' 5"  (1.651 m), weight 220 lb (99.8 kg), SpO2 100 %.  Assuming care from Dr. Zenda AlpersWebster.  In short, Monica Greene is a 25 y.o. female with a chief complaint of Chest Pain and Back Pain .  Refer to the original H&P for additional details.  The current plan of care is to discharge the patient home on Carafate. She's been advised take over-the-counter Tylenol. We followed her progress with a repeat troponin and her D-dimer test was negative and Dr. Zenda AlpersWebster felt that we could stop the evaluation for pulmonary embolism at that time. Pain is most likely gastrointestinal in nature and the patient was advised to return for any new concerns  Patient was advised to return immediately if condition worsens. Patient was advised to follow up with their primary care physician or other specialized physicians involved in their outpatient care. The patient and/or family member/power of attorney had laboratory results reviewed at the bedside. All questions and concerns were addressed and appropriate discharge instructions were distributed by the nursing staff.     Jennye MoccasinBrian S Quigley, MD 02/25/16 [phone]

## 2016-02-25 NOTE — ED Triage Notes (Addendum)
Pt c/o pain to the center of her chest that radiates through to her back; pt say she's had the same pain nightly for the last 3 nights; the last 2 nights she was able to take Ibuprofen to ease the pain; tonight the pain woke her from sleep and Ibuprofen has not worked; denies cough/fever; pt says pain is worse with deep inspiration and laying down; talking in complete coherent sentences

## 2016-02-25 NOTE — Discharge Instructions (Signed)
Please return immediately if condition worsens. Please contact her primary physician or the physician you were given for referral. If you have any specialist physicians involved in her treatment and plan please also contact them. Thank you for using  regional emergency Department. Pain was felt to be gastrointestinal in nature. Please take over-the-counter Tylenol for pain. He may also want to add over-the-counter Zantac or Pepcid. Please avoid nonsteroidal products such as ibuprofen, Aleve, etc. These medications may actually irritate the stomach and causes the discomfort to get worse. Please follow-up with your primary physician.

## 2016-02-25 NOTE — ED Provider Notes (Signed)
[facility] Emergency Department Provider Note   ____________________________________________   First MD Initiated Contact with Patient 02/25/16 [phone]     (approximate)  I have reviewed the triage vital signs and the nursing notes.   HISTORY  Chief Complaint Chest Pain and Back Pain    HPI Monica Greene is a 25 y.o. female who comes into the hospital today with chest pain and upper back pain. She reports that the pain woke her up out of her sleep tonight. She reports that she woke up last 2 mornings with the pain but it is improved with Tylenol. She reports though that tonight the pain did not get better after Tylenol. She rates the pain as 8 out of 10 in intensity and reports it is very sharp. She reports that the pain does not get worse when she lays down but does seem to get worse when she takes a deep breath in. The patient denies any sweats and denies any radiation of the pain. She is not had any nausea, vomiting, headache or blurred vision. The patient has never had these symptoms before. She denies any recent long car trips and does not have any abdominal pain. The patient is here for evaluation and treatment of her chest pain.   Past Medical History:  Diagnosis Date  . PCOS (polycystic ovarian syndrome)     There are no active problems to display for this patient.   Past Surgical History:  Procedure Laterality Date  . CESAREAN SECTION      Prior to Admission medications   Not on File    Allergies Review of patient's allergies indicates no known allergies.  History reviewed. No pertinent family history.  Social History Social History  Substance Use Topics  . Smoking status: Current Every Day Smoker    Packs/day: 0.50    Types: Cigarettes  . Smokeless tobacco: Never Used  . Alcohol use Yes    Review of Systems Constitutional: No fever/chills Eyes: No visual changes. ENT: No sore throat. Cardiovascular:  chest  pain. Respiratory:  shortness of breath. Gastrointestinal: No abdominal pain.  No nausea, no vomiting.  No diarrhea.  No constipation. Genitourinary: Negative for dysuria. Musculoskeletal: back pain. Skin: Negative for rash. Neurological: Negative for headaches, focal weakness or numbness.  10-point ROS otherwise negative.  ____________________________________________   PHYSICAL EXAM:  VITAL SIGNS: ED Triage Vitals   Enc Vitals Group     BP 116/84     Pulse Rate 78     Resp 20     Temp 97.8 F (36.6 C)     Temp Source Oral     SpO2 100 %     Weight 220 lb (99.8 kg)     Height 5\' 5"  (1.651 m)     Head Circumference      Peak Flow      Pain Score 8     Pain Loc      Pain Edu?      Excl. in GC?     Constitutional: Alert and oriented. Well appearing and in Moderate distress. Eyes: Conjunctivae are normal. PERRL. EOMI. Head: Atraumatic. Nose: No congestion/rhinnorhea. Mouth/Throat: Mucous membranes are moist.  Oropharynx non-erythematous. Cardiovascular: Normal rate, regular rhythm. Grossly normal heart sounds.  Good peripheral circulation. Respiratory: Normal respiratory effort.  No retractions. Lungs CTAB. Gastrointestinal: Soft and nontender. No distention. Positive bowel sounds Musculoskeletal: No lower extremity tenderness nor edema.  Neurologic:  Normal speech and language.  Skin:  Skin is warm,  dry and intact.  Psychiatric: Mood and affect are normal.   ____________________________________________   LABS (all labs ordered are listed, but only abnormal results are displayed)  Labs Reviewed  BASIC METABOLIC PANEL - Abnormal; Notable for the following:       Result Value   Glucose, Bld 121 (*)    All other components within normal limits  CBC - Abnormal; Notable for the following:    WBC 13.2 (*)    All other components within normal limits  TROPONIN I  FIBRIN DERIVATIVES D-DIMER ([facility] ONLY)   ____________________________________________  EKG  ED ECG  REPORT I, Rebecka ApleyWebster,  Allison P, the attending physician, personally viewed and interpreted this ECG.   Date: 02/25/2016  EKG Time: 0454  Rate: 84  Rhythm: normal sinus rhythm  Axis: normal  Intervals:none  ST&T Change: none  ____________________________________________  RADIOLOGY  CXR ____________________________________________   PROCEDURES  Procedure(s) performed: None  Procedures  Critical Care performed: No  ____________________________________________   INITIAL IMPRESSION / ASSESSMENT AND PLAN / ED COURSE  Pertinent labs & imaging results that were available during my care of the patient were reviewed by me and considered in my medical decision making (see chart for details).  This is a 25 year old female who comes into the hospital today with some chest pain with radiation to her back. The patient also reports that the pain is worse when she takes a deep breath. The patient has some blood work to evaluate her chest pain. She will receive a chest x-ray and we will check a d-dimer to determine if the patient needs a CT scan. I will give the patient a dose of Toradol as well as a GI cocktail. I will reassess the patient.  Clinical Course  Value Comment By Time  DG Chest 2 View No acute cardiopulmonary process seen Rebecka ApleyAllison P Webster, MD 09/04 0602   The patient's care will be signed out to Dr. Huel CoteQuigley. Her DDimer is negative. We will check another troponin. The patient will be reassessed.    ____________________________________________   FINAL CLINICAL IMPRESSION(S) / ED DIAGNOSES  Final diagnoses:  Chest pain, unspecified chest pain type  Midline thoracic back pain      NEW MEDICATIONS STARTED DURING THIS VISIT:  New Prescriptions   No medications on file     Note:  This document was prepared using Dragon voice recognition software and may include unintentional dictation errors.    Rebecka ApleyAllison P Webster, MD 02/25/16 [phone]

## 2016-02-25 NOTE — ED Notes (Signed)
Patient states for the past 3 days she has woken up with mid upper back pain (especially on the right) that radiates into her mid chest.  States she has taken Tylenol and pain will ease after approximately and hour.  Tonight the pain woke her up earlier and the Tylenol did not help.  Patient states pain worse with deep inspiration and laying down.

## 2016-05-23 HISTORY — PX: CHOLECYSTECTOMY: SHX55

## 2016-05-27 ENCOUNTER — Emergency Department: Payer: Medicaid Other

## 2016-05-27 ENCOUNTER — Encounter: Payer: Self-pay | Admitting: Emergency Medicine

## 2016-05-27 ENCOUNTER — Emergency Department
Admission: EM | Admit: 2016-05-27 | Discharge: 2016-05-28 | Disposition: A | Discharge status: Home / Self Care | Payer: Medicaid Other | Attending: Emergency Medicine | Admitting: Emergency Medicine

## 2016-05-27 DIAGNOSIS — F17210 Nicotine dependence, cigarettes, uncomplicated: Secondary | ICD-10-CM | POA: Insufficient documentation

## 2016-05-27 DIAGNOSIS — K229 Disease of esophagus, unspecified: Secondary | ICD-10-CM | POA: Insufficient documentation

## 2016-05-27 DIAGNOSIS — M546 Pain in thoracic spine: Secondary | ICD-10-CM

## 2016-05-27 LAB — CBC
HCT: 40.2 % (ref 35.0–47.0)
Hemoglobin: 13.7 g/dL (ref 12.0–16.0)
MCH: 29 pg (ref 26.0–34.0)
MCHC: 34.1 g/dL (ref 32.0–36.0)
MCV: 85.2 fL (ref 80.0–100.0)
Platelets: 230 K/uL (ref 150–440)
RBC: 4.71 MIL/uL (ref 3.80–5.20)
RDW: 14.4 % (ref 11.5–14.5)
WBC: 12.5 K/uL — ABNORMAL HIGH (ref 3.6–11.0)

## 2016-05-27 NOTE — ED Triage Notes (Signed)
Pt presents to ED with worsening thoracic back pain that radiates around to her chest with sob. Pt states she was sitting at work at onset. Pain increases when taking a deep breath and with movement. Pt states she was seen in this ED for the same a few months ago and was told her symptoms were related to reflux. Pt appears restless at time.

## 2016-05-28 LAB — BASIC METABOLIC PANEL
ANION GAP: 7 (ref 5–15)
BUN: 7 mg/dL (ref 6–20)
CALCIUM: 9.3 mg/dL (ref 8.9–10.3)
CO2: 27 mmol/L (ref 22–32)
Chloride: 103 mmol/L (ref 101–111)
Creatinine, Ser: 0.82 mg/dL (ref 0.44–1.00)
Glucose, Bld: 125 mg/dL — ABNORMAL HIGH (ref 65–99)
POTASSIUM: 3.9 mmol/L (ref 3.5–5.1)
SODIUM: 137 mmol/L (ref 135–145)

## 2016-05-28 LAB — TROPONIN I

## 2016-05-28 LAB — FIBRIN DERIVATIVES D-DIMER (ARMC ONLY): Fibrin derivatives D-dimer (ARMC): 423 (ref 0–499)

## 2016-05-28 MED ORDER — FAMOTIDINE 40 MG PO TABS: 40.0000 mg | ORAL_TABLET | Freq: Every evening | ORAL | 1 refills | Status: DC

## 2016-05-28 MED ORDER — OXYCODONE-ACETAMINOPHEN 5-325 MG PO TABS
1.0000 | ORAL_TABLET | Freq: Once | ORAL | Status: AC
  Administered 2016-05-28: 1 via ORAL
  Filled 2016-05-28: qty 1

## 2016-05-28 MED ORDER — MORPHINE SULFATE (PF) 4 MG/ML IV SOLN
4.0000 mg | Freq: Once | INTRAVENOUS | Status: AC
  Administered 2016-05-28: 4 mg via INTRAVENOUS
  Filled 2016-05-28: qty 1

## 2016-05-28 MED ORDER — GI COCKTAIL ~~LOC~~
30.0000 mL | Freq: Once | ORAL | Status: AC
  Administered 2016-05-28: 30 mL via ORAL
  Filled 2016-05-28: qty 30

## 2016-05-28 MED ORDER — FAMOTIDINE 20 MG PO TABS
40.0000 mg | ORAL_TABLET | Freq: Once | ORAL | Status: AC
  Administered 2016-05-28: 40 mg via ORAL
  Filled 2016-05-28: qty 2

## 2016-05-28 MED ORDER — SUCRALFATE 1 G PO TABS
1.0000 g | ORAL_TABLET | Freq: Once | ORAL | Status: AC
  Administered 2016-05-28: 1 g via ORAL
  Filled 2016-05-28: qty 1

## 2016-05-28 MED ORDER — SODIUM CHLORIDE 0.9 % IV BOLUS (SEPSIS)
1000.0000 mL | Freq: Once | INTRAVENOUS | Status: AC
  Administered 2016-05-28: 1000 mL via INTRAVENOUS

## 2016-05-28 MED ORDER — OXYCODONE-ACETAMINOPHEN 5-325 MG PO TABS: 1.0000 | ORAL_TABLET | Freq: Four times a day (QID) | ORAL | 0 refills | Status: DC | PRN

## 2016-05-28 MED ORDER — ONDANSETRON HCL 4 MG/2ML IJ SOLN
4.0000 mg | Freq: Once | INTRAMUSCULAR | Status: AC
  Administered 2016-05-28: 4 mg via INTRAVENOUS
  Filled 2016-05-28: qty 2

## 2016-05-28 MED ORDER — SUCRALFATE 1 G PO TABS: 1.0000 g | ORAL_TABLET | Freq: Two times a day (BID) | ORAL | 0 refills | Status: DC

## 2016-05-28 NOTE — ED Notes (Signed)
Report off to lisa rn  

## 2016-05-28 NOTE — ED Notes (Signed)
Pt states she has mid back pain.  Pt taking otc meds without relief.  Pt also has nausea and is coughing up phlegm.  Pt states she here recently with similar sx and was dx with reflux.  Pt is cig smoker and is not taking any meds for reflux.  Pt alert.

## 2016-05-28 NOTE — ED Notes (Addendum)
Pt uprite on stretcher in exam room with no distress noted, watching TV; pt c/o upper back pain 5/10; st obtained relief of pain with morphine admin earlier but now pain is returning accomp by nausea; pt informed of plan of care and MD notified of pt's c/o; card monitor in place, NSR on monitor

## 2016-05-28 NOTE — ED Notes (Signed)
Iv started  meds given.   

## 2016-05-28 NOTE — ED Provider Notes (Signed)
[facility] Emergency Department Provider Note   ____________________________________________   First MD Initiated Contact with Patient 05/28/16 0015     (approximate)  I have reviewed the triage vital signs and the nursing notes.   HISTORY  Chief Complaint Back Pain; Shortness of Breath; and Chest Pain    HPI Monica Greene is a 25 y.o. female who comes into the hospital today with back pain. The patient reports that this is pain in her upper mid back. The pain started around 3:30 today. She was sitting at a table coloring when the pain started. The patient reports that it doesn't hurt with movement and it doesn't radiate. She's had some nausea vomiting and sweats. She also is felt hot and cold and has had some chest tightness. She denies any change with deep breath. The patient tried taking Tylenol and ibuprofen at home but reports that hasn't helped. She's vomited all the medicine up. The patient rates her pain a 10 out of 10 in intensity. The patient had similar symptoms once before and was told it was indigestion. This was 3-4 months ago. Prior to the symptoms the patient did have some cheese fries for lunch. She's had no urinary discomfort or blood in his urine. The patient is here today for evaluation.   Past Medical History:  Diagnosis Date  . PCOS (polycystic ovarian syndrome)     There are no active problems to display for this patient.   Past Surgical History:  Procedure Laterality Date  . CESAREAN SECTION      Prior to Admission medications   Medication Sig Start Date End Date Taking? Authorizing Provider  famotidine (PEPCID) 40 MG tablet Take 1 tablet (40 mg total) by mouth every evening. 05/28/16 05/28/17  Rebecka ApleyAllison P Webster, MD  oxyCODONE-acetaminophen (ROXICET) 5-325 MG tablet Take 1 tablet by mouth every 6 (six) hours as needed. 05/28/16   Rebecka ApleyAllison P Webster, MD  sucralfate (CARAFATE) 1 g tablet Take 1 tablet (1 g total) by mouth 2 (two)  times daily. 05/28/16   Rebecka ApleyAllison P Webster, MD    Allergies Patient has no known allergies.  History reviewed. No pertinent family history.  Social History Social History  Substance Use Topics  . Smoking status: Current Every Day Smoker    Packs/day: 0.50    Types: Cigarettes  . Smokeless tobacco: Never Used  . Alcohol use Yes    Review of Systems Constitutional: Sweats Eyes: No visual changes. ENT: No sore throat. Cardiovascular: Chest tightness Respiratory: Denies shortness of breath. Gastrointestinal: Nausea and vomiting No abdominal pain.  No diarrhea.  No constipation. Genitourinary: Negative for dysuria. Musculoskeletal:  back pain. Skin: Negative for rash. Neurological: Negative for headaches, focal weakness or numbness.  10-point ROS otherwise negative.  ____________________________________________   PHYSICAL EXAM:  VITAL SIGNS: ED Triage Vitals  Enc Vitals Group     BP 05/27/16 2333 128/87     Pulse Rate 05/27/16 2333 74     Resp 05/27/16 2333 (!) 26     Temp 05/27/16 2333 98 F (36.7 C)     Temp Source 05/27/16 2333 Oral     SpO2 05/27/16 2333 100 %     Weight 05/27/16 2330 220 lb (99.8 kg)     Height 05/27/16 2330 5\' 5"  (1.651 m)     Head Circumference --      Peak Flow --      Pain Score 05/27/16 2330 10     Pain Loc --  Pain Edu? --      Excl. in GC? --     Constitutional: Alert and oriented. Well appearing and in Mild to moderate distress. Eyes: Conjunctivae are normal. PERRL. EOMI. Head: Atraumatic. Nose: No congestion/rhinnorhea. Mouth/Throat: Mucous membranes are moist.  Oropharynx non-erythematous. Cardiovascular: Normal rate, regular rhythm. Grossly normal heart sounds.  Good peripheral circulation. Respiratory: Normal respiratory effort.  No retractions. Lungs CTAB. Gastrointestinal: Soft and nontender. No distention. Positive bowel sounds Musculoskeletal: No lower extremity tenderness nor edema.   Neurologic:  Normal speech and  language. Skin:  Skin is warm, dry and intact. Marland Kitchen. Psychiatric: Mood and affect are normal.   ____________________________________________   LABS (all labs ordered are listed, but only abnormal results are displayed)  Labs Reviewed  BASIC METABOLIC PANEL - Abnormal; Notable for the following:       Result Value   Glucose, Bld 125 (*)    All other components within normal limits  CBC - Abnormal; Notable for the following:    WBC 12.5 (*)    All other components within normal limits  TROPONIN I  FIBRIN DERIVATIVES D-DIMER ([facility] ONLY)  TROPONIN I   ____________________________________________  EKG  ED ECG REPORT I, Rebecka ApleyWebster,  Allison P, the attending physician, personally viewed and interpreted this ECG.   Date: 05/27/2016  EKG Time: 2331  Rate: 74  Rhythm: normal sinus rhythm  Axis: normal  Intervals:none  ST&T Change: none   ____________________________________________  RADIOLOGY  CXR ____________________________________________   PROCEDURES  Procedure(s) performed: None  Procedures  Critical Care performed: No  ____________________________________________   INITIAL IMPRESSION / ASSESSMENT AND PLAN / ED COURSE  Pertinent labs & imaging results that were available during my care of the patient were reviewed by me and considered in my medical decision making (see chart for details).  This is a 25 year old female who comes into the hospital today with some back pain and chest tightness. The patient reports that the pain is not to palpation it's inside. I gave the patient a GI cocktail as well as a dose of morphine. The patient reports that the pain was improved. I checked a d-dimer and that was unremarkable. I also give the patient some Pepcid and some Carafate. She reports that the morphine did help but started to wear off and the pain did return slightly. I did give the patient a dose of Percocet. After looking at the rest of the blood work and the repeat  troponin being negative I feel that the patient may have some esophageal disease causing her symptoms. I discussed this with the patient and stated that since she had had this before she should follow-up with the GI doctor. The patient understands and agrees with the plan. She will be discharged to home to [facility] as well as [facility].  Clinical Course as of May 28 457  Wed May 28, 2016  0039 No active cardiopulmonary disease. DG Chest 2 View [AW]    Clinical Course User Index [AW] Rebecka ApleyAllison P Webster, MD     ____________________________________________   FINAL CLINICAL IMPRESSION(S) / ED DIAGNOSES  Final diagnoses:  Acute midline thoracic back pain  Esophageal disease      NEW MEDICATIONS STARTED DURING THIS VISIT:  New Prescriptions   FAMOTIDINE (PEPCID) 40 MG TABLET    Take 1 tablet (40 mg total) by mouth every evening.   OXYCODONE-ACETAMINOPHEN (ROXICET) 5-325 MG TABLET    Take 1 tablet by mouth every 6 (six) hours as needed.  SUCRALFATE (CARAFATE) 1 G TABLET    Take 1 tablet (1 g total) by mouth 2 (two) times daily.     Note:  This document was prepared using Dragon voice recognition software and may include unintentional dictation errors.    Rebecka Apley, MD 05/28/16 [phone]

## 2016-06-12 ENCOUNTER — Ambulatory Visit: Payer: Self-pay | Admitting: Gastroenterology

## 2018-03-03 IMAGING — CR DG CHEST 2V
1 series · 2 of 2 positions shown · non-contrast
Comparison: Prior radiograph from 02/25/2016.

CLINICAL DATA: Initial evaluation for acute shortness breath.

EXAM:
CHEST  2 VIEW

[Series 1: dg chest 2 view · 0.14mm/px · 2 of 2 slices shown]
[im 1/2]
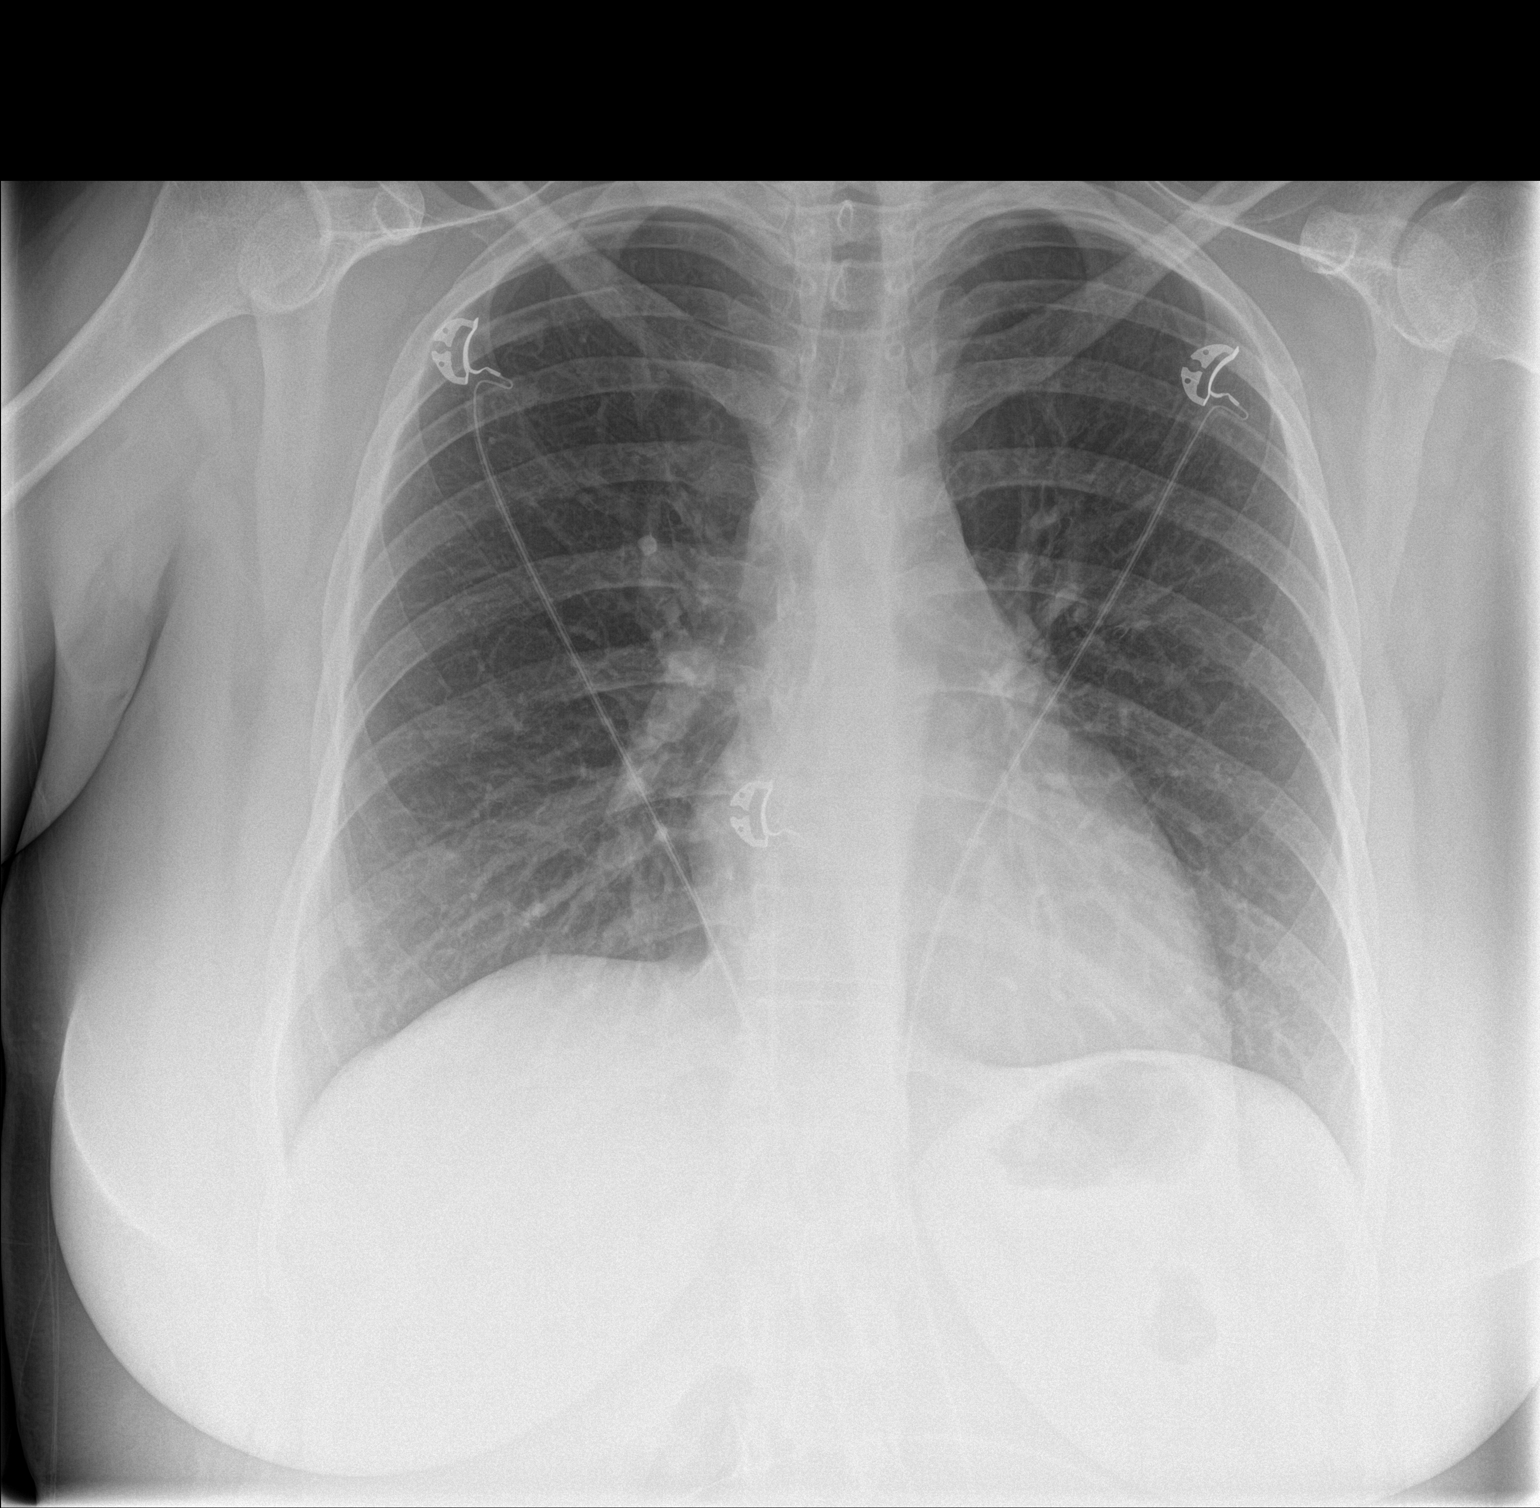
[im 2/2]
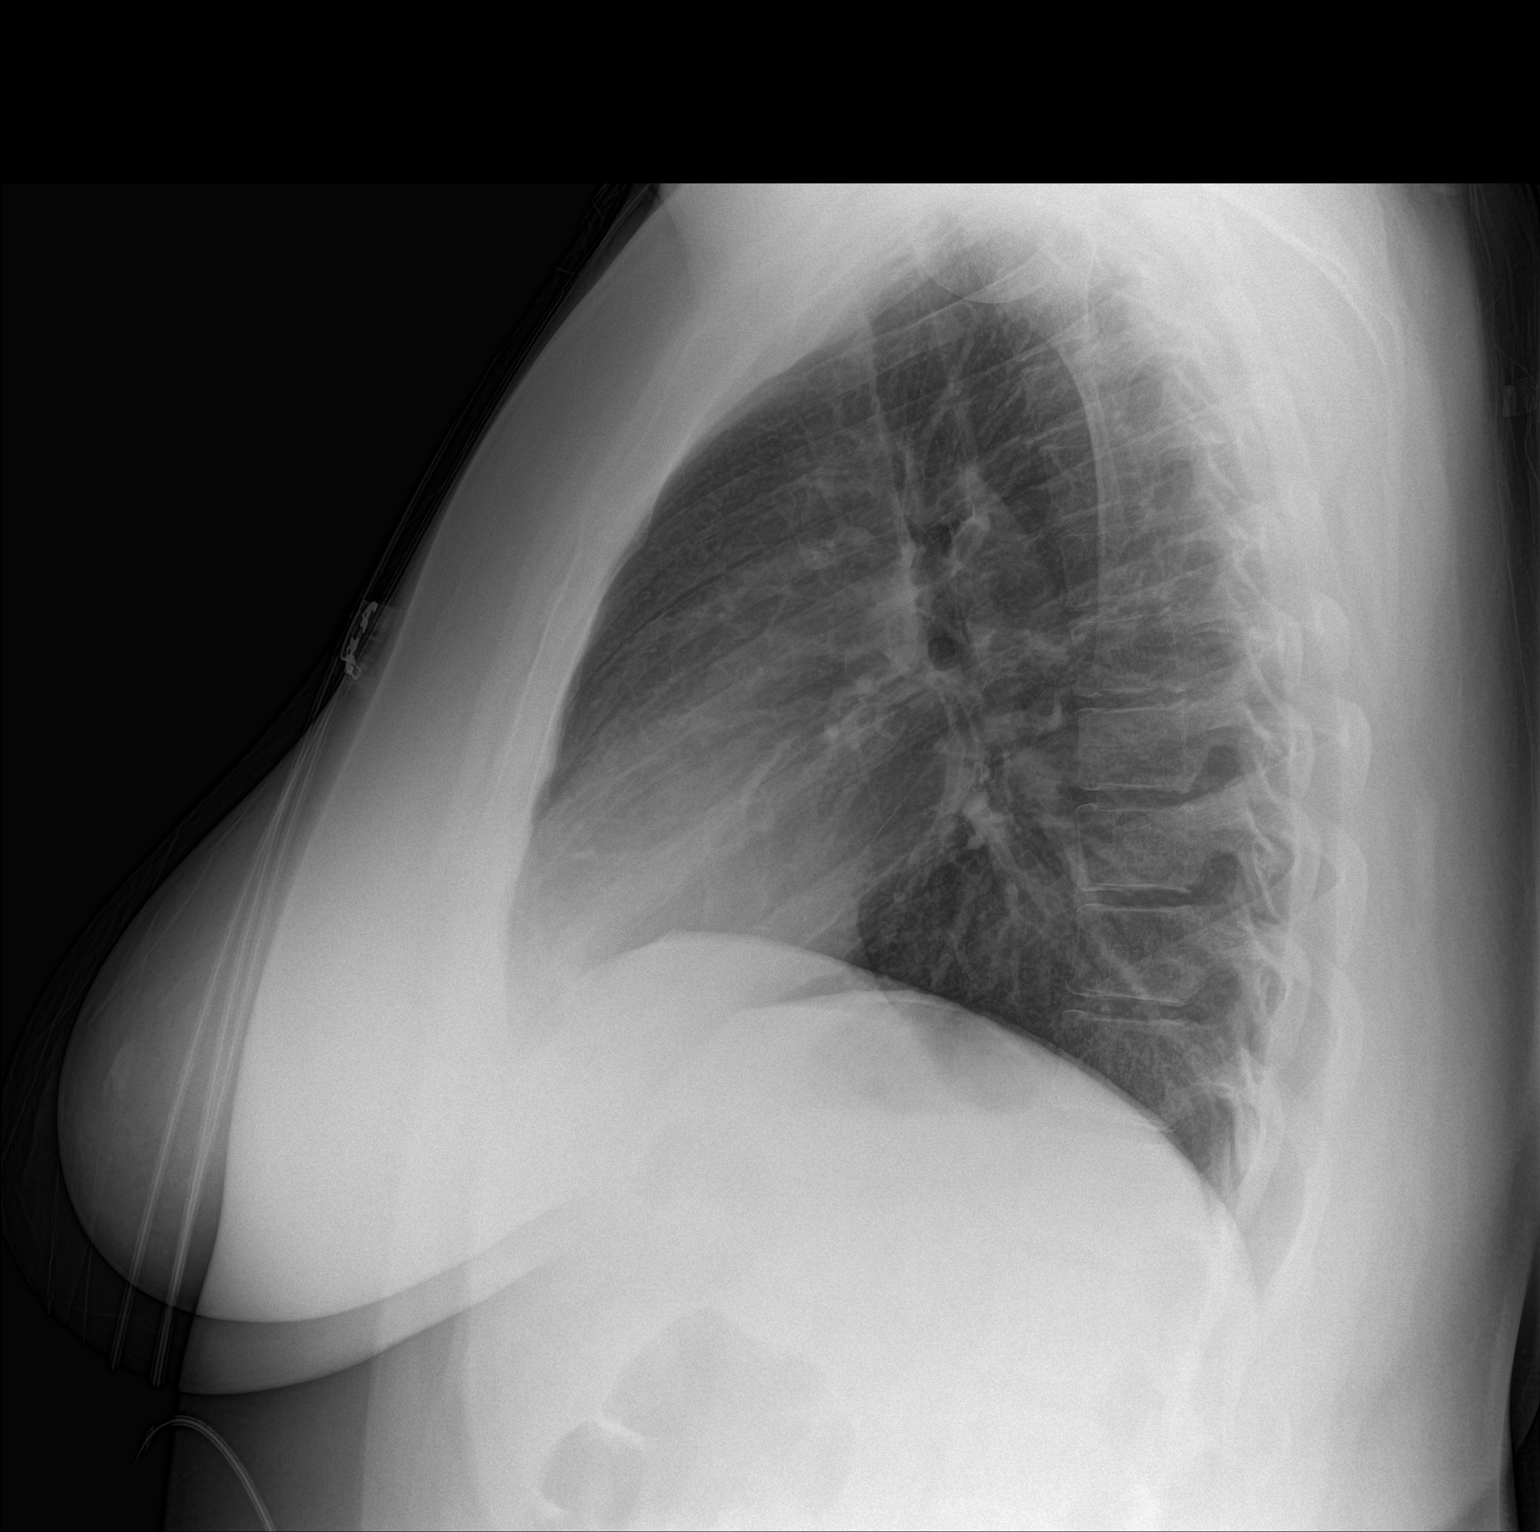

[2 of 2 positions shown; findings below may reference images not displayed]

FINDINGS: The cardiac and mediastinal silhouettes are stable in size and
contour, and remain within normal limits.

The lungs are normally inflated. No airspace consolidation, pleural
effusion, or pulmonary edema is identified. There is no
pneumothorax.

No acute osseous abnormality identified.
IMPRESSION: No active cardiopulmonary disease.

## 2018-03-04 ENCOUNTER — Telehealth: Payer: Self-pay | Admitting: Obstetrics and Gynecology

## 2018-03-04 NOTE — Telephone Encounter (Signed)
Patient coming in on 03/05/18 at 3:50 with SDJ for Mirena

## 2018-03-05 ENCOUNTER — Ambulatory Visit (INDEPENDENT_AMBULATORY_CARE_PROVIDER_SITE_OTHER): Payer: Medicaid Other | Admitting: Obstetrics and Gynecology

## 2018-03-05 ENCOUNTER — Other Ambulatory Visit (HOSPITAL_COMMUNITY)
Admission: RE | Admit: 2018-03-05 | Discharge: 2018-03-05 | Disposition: A | Discharge status: Home / Self Care | Payer: Self-pay | Source: Ambulatory Visit | Attending: Obstetrics and Gynecology | Admitting: Obstetrics and Gynecology

## 2018-03-05 ENCOUNTER — Encounter: Payer: Self-pay | Admitting: Obstetrics and Gynecology

## 2018-03-05 VITALS — BP 112/62 | HR 85 | Ht 65.0 in | Wt 215.0 lb

## 2018-03-05 DIAGNOSIS — Z113 Encounter for screening for infections with a predominantly sexual mode of transmission: Secondary | ICD-10-CM

## 2018-03-05 DIAGNOSIS — Z30433 Encounter for removal and reinsertion of intrauterine contraceptive device: Secondary | ICD-10-CM

## 2018-03-05 DIAGNOSIS — Z124 Encounter for screening for malignant neoplasm of cervix: Secondary | ICD-10-CM

## 2018-03-05 DIAGNOSIS — A749 Chlamydial infection, unspecified: Secondary | ICD-10-CM | POA: Insufficient documentation

## 2018-03-05 NOTE — Telephone Encounter (Signed)
Mirena reserved for this patient. 

## 2018-03-07 ENCOUNTER — Encounter: Payer: Self-pay | Admitting: Obstetrics and Gynecology

## 2018-03-07 MED ORDER — LEVONORGESTREL 20 MCG/24HR IU IUD: 1.0000 | INTRAUTERINE_SYSTEM | Freq: Once | INTRAUTERINE | 0 refills | Status: AC

## 2018-03-07 NOTE — Progress Notes (Signed)
   IUD Removal  Patient identified, informed consent performed, consent signed.  Patient was in the dorsal lithotomy position, normal external genitalia was noted.  A speculum was placed in the patient's vagina, normal discharge was noted, no lesions. The cervix was visualized, no lesions, no abnormal discharge.  The strings of the IUD were grasped and pulled using ring forceps. The IUD was removed in its entirety.   IUD Insertion Procedure Note Patient identified, informed consent performed, consent signed.   Discussed risks of irregular bleeding, cramping, infection, malpositioning, expulsion or uterine perforation of the IUD (1:1000 placements)  which may require further procedure such as laparoscopy.  IUD while effective at preventing pregnancy do not prevent transmission of sexually transmitted diseases and use of barrier methods for this purpose was discussed. Time out was performed.  Urine pregnancy test negative.  Speculum already placed in the vagina.  Cervix visualized.  Cleaned with Betadine x 2.  Grasped anteriorly with a single tooth tenaculum.  Uterus sounded to 8 cm. IUD placed per manufacturer's recommendations.  Strings trimmed to 3 cm. Tenaculum was removed, good hemostasis noted.  Patient tolerated procedure well.   Patient was given post-procedure instructions.  She was advised to have backup contraception for one week.  Patient was also asked to check IUD strings periodically and follow up in 4 weeks for IUD check.  Pap smear performed due to patient out of date on pap smear.   Thomasene MohairStephen Aleeta Schmaltz, MD, Merlinda FrederickFACOG [facility], [facility] 03/07/2018 5:43 PM

## 2018-03-10 LAB — CYTOLOGY - PAP
CHLAMYDIA, DNA PROBE: POSITIVE — AB
Diagnosis: NEGATIVE
NEISSERIA GONORRHEA: NEGATIVE
Trichomonas: NEGATIVE

## 2018-03-11 ENCOUNTER — Telehealth: Payer: Self-pay | Admitting: Obstetrics and Gynecology

## 2018-03-11 NOTE — Telephone Encounter (Signed)
Attempted to call to discuss STD testing result.  Voicemail not set up. No alternative number.  Will continue to call.

## 2018-03-12 ENCOUNTER — Encounter: Payer: Self-pay | Admitting: Obstetrics and Gynecology

## 2018-03-12 NOTE — Telephone Encounter (Signed)
Attempted to call. This time there was a voicemail that was set up. However, the mailbox was full. Will continue to call. I will go ahead and send her a letter, anticipating it will be difficult to contact her.

## 2018-03-22 ENCOUNTER — Other Ambulatory Visit: Payer: Self-pay | Admitting: Obstetrics and Gynecology

## 2018-03-22 ENCOUNTER — Telehealth: Payer: Self-pay | Admitting: Obstetrics and Gynecology

## 2018-03-22 DIAGNOSIS — A5609 Other chlamydial infection of lower genitourinary tract: Secondary | ICD-10-CM

## 2018-03-22 MED ORDER — AZITHROMYCIN 500 MG PO TABS: 1000.0000 mg | ORAL_TABLET | Freq: Every day | ORAL | 0 refills | Status: AC

## 2018-03-22 NOTE — Telephone Encounter (Signed)
Patient aware of diagnosis of chlamydia. Will send in Rx. She states she received my letter today. She voiced understanding of importance of taking medication and having her partner treated.

## 2018-04-02 ENCOUNTER — Ambulatory Visit (INDEPENDENT_AMBULATORY_CARE_PROVIDER_SITE_OTHER): Payer: Medicaid Other | Admitting: Obstetrics and Gynecology

## 2018-04-02 ENCOUNTER — Encounter: Payer: Self-pay | Admitting: Obstetrics and Gynecology

## 2018-04-02 VITALS — BP 118/74 | Ht 65.0 in | Wt 212.0 lb

## 2018-04-02 DIAGNOSIS — Z30431 Encounter for routine checking of intrauterine contraceptive device: Secondary | ICD-10-CM

## 2018-04-02 NOTE — Progress Notes (Signed)
   IUD String Check  Subjctive: [name] presents for IUD string check.  She had a Mirena placed 4 weeks ago.  Since placement of her IUD she had not had vaginal bleeding.  She denies cramping or discomfort.  She has not had intercourse since placement.  She has not checked the strings.  She denies any fever, chills, nausea, vomiting, or other complaints.    She did take her medication for chlamydia and still has no symptoms.   Objective: BP 118/74   Ht 5\' 5"  (1.651 m)   Wt 212 lb (96.2 kg)   BMI 35.28 kg/m  Physical Exam  Constitutional: She is oriented to person, place, and time. She appears well-developed and well-nourished. No distress.  HENT:  Head: Normocephalic and atraumatic.  Nose: Nose normal.  Eyes: Conjunctivae are normal.  Neck: Normal range of motion.  Cardiovascular: Normal rate, regular rhythm and normal heart sounds.  Pulmonary/Chest: Effort normal and breath sounds normal.  Abdominal: Soft. She exhibits no distension. There is no tenderness. There is no rebound and no guarding.  Genitourinary: Uterus normal. Pelvic exam was performed with patient supine. There is no rash, tenderness or lesion on the right labia. There is no rash, tenderness or lesion on the left labia. Uterus is not tender. Cervix exhibits no motion tenderness and no discharge. Right adnexum displays no mass. Left adnexum displays no mass. No vaginal discharge found.  Genitourinary Comments: IUD String present and about 3 cm in length. Not trimmed today.  Musculoskeletal: Normal range of motion.  Lymphadenopathy:       Right: No inguinal adenopathy present.       Left: No inguinal adenopathy present.  Neurological: She is alert and oriented to person, place, and time.  Skin: Skin is warm and dry. No rash noted.  Psychiatric: She has a normal mood and affect. Her behavior is normal. Judgment normal.   Female chaperone was present for the entirety of the pelvic exam  Assessment: 27  y.o. year old female status post prior Mirena IUD placement 4 week ago, doing well.  Plan: 1.  The patient was given instructions to check her IUD strings monthly and call with any problems or concerns.  She should call for fevers, chills, abnormal vaginal discharge, pelvic pain, or other complaints. 2.  She will return for a annual exam in 1 year.  All questions answered.  15 minutes spent in face to face discussion with > 50% spent in counseling, management, and coordination of care for her newly-placed IUD.  Risks and benefits of IUD discussed including the risks of irregular bleeding, cramping, infection, malpositioning, expulsion, which may require further procedures such as laparoscopy.  IUDs while effective at preventing pregnancy do not prevent transmission of sexually transmitted diseases and use of barrier methods for this purpose was discussed.  Low overall incidence of failure with 99.7% efficacy rate in typical use.    Thomasene Mohair, MD 04/02/2018 4:42 PM

## 2019-01-13 ENCOUNTER — Ambulatory Visit (INDEPENDENT_AMBULATORY_CARE_PROVIDER_SITE_OTHER): Payer: Medicaid Other | Admitting: Obstetrics and Gynecology

## 2019-01-13 ENCOUNTER — Other Ambulatory Visit (INDEPENDENT_AMBULATORY_CARE_PROVIDER_SITE_OTHER): Payer: Self-pay

## 2019-01-13 ENCOUNTER — Other Ambulatory Visit: Payer: Self-pay

## 2019-01-13 ENCOUNTER — Encounter: Payer: Self-pay | Admitting: Obstetrics and Gynecology

## 2019-01-13 VITALS — BP 126/84 | Ht 65.0 in | Wt 211.0 lb

## 2019-01-13 DIAGNOSIS — Z3202 Encounter for pregnancy test, result negative: Secondary | ICD-10-CM

## 2019-01-13 DIAGNOSIS — Z3200 Encounter for pregnancy test, result unknown: Secondary | ICD-10-CM

## 2019-01-13 DIAGNOSIS — Z30431 Encounter for routine checking of intrauterine contraceptive device: Secondary | ICD-10-CM

## 2019-01-13 DIAGNOSIS — R110 Nausea: Secondary | ICD-10-CM

## 2019-01-13 LAB — POCT URINE PREGNANCY: Preg Test, Ur: NEGATIVE

## 2019-01-13 NOTE — Progress Notes (Signed)
Obstetrics & Gynecology Office Visit   Chief Complaint  Patient presents with   Follow-up    IUD check with + UPT   History of Present Illness: 28 y.o. female who presents with a positive home pregnancy test 2 days ago.  She is also had symptoms of pregnancy, such as breast tenderness, nausea, fatigue.  She took another pregnancy test that did not show a control line, but did show a positive test line.  Her test here in clinic today is negative.  She does not believe her IUD has fallen out.  She also has not had any regular periods or any bleeding at all since the placement of her IUD.    Past Medical History:  Diagnosis Date   PCOS (polycystic ovarian syndrome)     Past Surgical History:  Procedure Laterality Date   CESAREAN SECTION     CHOLECYSTECTOMY  05/2016    Gynecologic History: No LMP recorded. (Menstrual status: IUD).  Obstetric History: No obstetric history on file.  History reviewed. No pertinent family history.  Social History   Socioeconomic History   Marital status: Legally Separated    Spouse name: Not on file   Number of children: Not on file   Years of education: Not on file   Highest education level: Not on file  Occupational History   Not on file  Social Needs   Financial resource strain: Not on file   Food insecurity    Worry: Not on file    Inability: Not on file   Transportation needs    Medical: Not on file    Non-medical: Not on file  Tobacco Use   Smoking status: Current Every Day Smoker    Packs/day: 0.50    Types: Cigarettes   Smokeless tobacco: Never Used  Substance and Sexual Activity   Alcohol use: Yes   Drug use: No   Sexual activity: Yes    Birth control/protection: I.U.D.  Lifestyle   Physical activity    Days per week: Not on file    Minutes per session: Not on file   Stress: Not on file  Relationships   Social connections    Talks on phone: Not on file    Gets together: Not on file    Attends  religious service: Not on file    Active member of club or organization: Not on file    Attends meetings of clubs or organizations: Not on file    Relationship status: Not on file   Intimate partner violence    Fear of current or ex partner: Not on file    Emotionally abused: Not on file    Physically abused: Not on file    Forced sexual activity: Not on file  Other Topics Concern   Not on file  Social History Narrative   Not on file    No Known Allergies  Prior to Admission medications   Medication Sig Start Date End Date Taking? Authorizing Provider  levonorgestrel (MIRENA) 20 MCG/24HR IUD 1 Intra Uterine Device (1 each total) by Intrauterine route once for 1 dose. 03/05/18 03/05/18  Will Bonnet, MD    Review of Systems  Constitutional: Positive for malaise/fatigue.  HENT: Negative.   Eyes: Negative.   Respiratory: Negative.   Cardiovascular: Negative.   Gastrointestinal: Negative.   Genitourinary: Negative.   Musculoskeletal: Negative.   Skin: Negative.        Breast tenderness  Neurological: Negative.   Psychiatric/Behavioral: Negative.  Physical Exam BP 126/84    Ht 5\' 5"  (1.651 m)    Wt 211 lb (95.7 kg)    BMI 35.11 kg/m  No LMP recorded. (Menstrual status: IUD). Physical Exam Constitutional:      General: She is not in acute distress.    Appearance: Normal appearance. She is well-developed.  Genitourinary:     Pelvic exam was performed with patient in the lithotomy position.     Vulva, inguinal canal, urethra, bladder, vagina, uterus, right adnexa and left adnexa normal.     No posterior fourchette tenderness, injury or lesion present.     No cervical friability, lesion, bleeding or polyp.     IUD strings visualized.     Genitourinary Comments: IUD strings visible  HENT:     Head: Normocephalic and atraumatic.  Eyes:     General: No scleral icterus.    Conjunctiva/sclera: Conjunctivae normal.  Neck:     Musculoskeletal: Normal range of  motion and neck supple.  Cardiovascular:     Rate and Rhythm: Normal rate and regular rhythm.     Heart sounds: No murmur. No friction rub. No gallop.   Pulmonary:     Effort: Pulmonary effort is normal. No respiratory distress.     Breath sounds: Normal breath sounds. No wheezing or rales.  Abdominal:     General: Bowel sounds are normal. There is no distension.     Palpations: Abdomen is soft. There is no mass.     Tenderness: There is no abdominal tenderness. There is no guarding or rebound.  Musculoskeletal: Normal range of motion.  Neurological:     General: No focal deficit present.     Mental Status: She is alert and oriented to person, place, and time.     Cranial Nerves: No cranial nerve deficit.  Skin:    General: Skin is warm and dry.     Findings: No erythema.  Psychiatric:        Mood and Affect: Mood normal.        Behavior: Behavior normal.        Judgment: Judgment normal.    Female chaperone present for pelvic and breast  portions of the physical exam  Bedside u/s shows IUD in apparently the correct location.  Adnexal structures not visualized. Uterus also not well visualized.   Urine pregnancy test: negative  Assessment: 28 y.o. No obstetric history on file. female here for  1. Possible pregnancy   2. Nausea      Plan: Problem List Items Addressed This Visit    None    Visit Diagnoses    Possible pregnancy    -  Primary   Relevant Orders   Beta hCG quant (ref lab)   US PELVIS TRANSVAGINAL NON-OB (TV ONLY)   Nausea       Relevant Orders   POCT urine pregnancy (Completed)     No signs of pregnancy today.  I had a lot of limitations in my bedside ultrasound.  So, we will get a pelvic ultrasound and a quantitative hCG.  Will manage accordingly based on this.  15 minutes spent in face to face discussion with > 50% spent in counseling,management, and coordination of care of her possible pregnancy.   Thomasene MohairStephen Ruba Outen, MD 01/13/2019 11:52 AM

## 2019-01-14 LAB — BETA HCG QUANT (REF LAB): hCG Quant: <1 m[IU]/mL

## 2019-08-09 NOTE — Telephone Encounter (Signed)
Mirena rcvd/charged 03/05/18

## 2019-08-18 ENCOUNTER — Ambulatory Visit: Payer: Medicaid Other | Admitting: Obstetrics and Gynecology
